# Patient Record
Sex: Male | Born: 1935
Health system: Southern US, Community
[De-identification: ages and names within clinical notes are randomized; demographics above are authoritative.]

## PROBLEM LIST (undated history)

## (undated) DIAGNOSIS — IMO0001 Reserved for inherently not codable concepts without codable children: Secondary | ICD-10-CM

## (undated) DIAGNOSIS — T300 Burn of unspecified body region, unspecified degree: Secondary | ICD-10-CM

## (undated) DIAGNOSIS — I10 Essential (primary) hypertension: Secondary | ICD-10-CM

## (undated) DIAGNOSIS — I2510 Atherosclerotic heart disease of native coronary artery without angina pectoris: Secondary | ICD-10-CM

## (undated) DIAGNOSIS — H409 Unspecified glaucoma: Secondary | ICD-10-CM

## (undated) DIAGNOSIS — F329 Major depressive disorder, single episode, unspecified: Secondary | ICD-10-CM

## (undated) DIAGNOSIS — H547 Unspecified visual loss: Secondary | ICD-10-CM

## (undated) DIAGNOSIS — E785 Hyperlipidemia, unspecified: Secondary | ICD-10-CM

## (undated) DIAGNOSIS — H9190 Unspecified hearing loss, unspecified ear: Secondary | ICD-10-CM

## (undated) DIAGNOSIS — F32A Depression, unspecified: Secondary | ICD-10-CM

## (undated) DIAGNOSIS — Z9289 Personal history of other medical treatment: Secondary | ICD-10-CM

## (undated) DIAGNOSIS — I6350 Cerebral infarction due to unspecified occlusion or stenosis of unspecified cerebral artery: Secondary | ICD-10-CM

## (undated) DIAGNOSIS — I6529 Occlusion and stenosis of unspecified carotid artery: Secondary | ICD-10-CM

## (undated) HISTORY — DX: Atherosclerotic heart disease of native coronary artery without angina pectoris: I25.10

## (undated) HISTORY — DX: Unspecified hearing loss, unspecified ear: H91.90

## (undated) HISTORY — DX: Essential (primary) hypertension: I10

## (undated) HISTORY — DX: Depression, unspecified: F32.A

## (undated) HISTORY — PX: HERNIA REPAIR: SHX51

## (undated) HISTORY — DX: Unspecified visual loss: H54.7

## (undated) HISTORY — DX: Occlusion and stenosis of unspecified carotid artery: I65.29

## (undated) HISTORY — DX: Burn of unspecified body region, unspecified degree: T30.0

## (undated) HISTORY — DX: Personal history of other medical treatment: Z92.89

## (undated) HISTORY — DX: Cerebral infarction due to unspecified occlusion or stenosis of unspecified cerebral artery: I63.50

## (undated) HISTORY — DX: Reserved for inherently not codable concepts without codable children: IMO0001

## (undated) HISTORY — DX: Unspecified glaucoma: H40.9

## (undated) HISTORY — DX: Hyperlipidemia, unspecified: E78.5

## (undated) HISTORY — DX: Major depressive disorder, single episode, unspecified: F32.9

---

## 2007-05-17 ENCOUNTER — Ambulatory Visit: Payer: Self-pay | Admitting: Gastroenterology

## 2010-08-15 ENCOUNTER — Ambulatory Visit: Payer: Self-pay | Admitting: Ophthalmology

## 2010-08-25 ENCOUNTER — Ambulatory Visit: Payer: Self-pay | Admitting: Ophthalmology

## 2011-01-28 ENCOUNTER — Ambulatory Visit: Payer: Self-pay | Admitting: Ophthalmology

## 2011-08-21 ENCOUNTER — Inpatient Hospital Stay: Payer: Self-pay | Admitting: Internal Medicine

## 2011-08-21 ENCOUNTER — Ambulatory Visit: Payer: Self-pay | Admitting: Oncology

## 2011-08-21 LAB — URINALYSIS, COMPLETE
Bacteria: NONE SEEN
Glucose,UR: NEGATIVE mg/dL (ref 0–75)
Nitrite: NEGATIVE
RBC,UR: 4 /HPF (ref 0–5)
Specific Gravity: 1.017 (ref 1.003–1.030)
WBC UR: <1 /HPF (ref 0–5)

## 2011-08-21 LAB — COMPREHENSIVE METABOLIC PANEL
Alkaline Phosphatase: 80 U/L (ref 50–136)
BUN: 23 mg/dL — ABNORMAL HIGH (ref 7–18)
Bilirubin,Total: 0.4 mg/dL (ref 0.2–1.0)
Chloride: 102 mmol/L (ref 98–107)
Co2: 27 mmol/L (ref 21–32)
Creatinine: 1.31 mg/dL — ABNORMAL HIGH (ref 0.60–1.30)
EGFR (African American): >60
EGFR (Non-African Amer.): 56 — ABNORMAL LOW
Osmolality: 282 (ref 275–301)
Potassium: 3.9 mmol/L (ref 3.5–5.1)
SGOT(AST): 33 U/L (ref 15–37)
Sodium: 139 mmol/L (ref 136–145)
Total Protein: 7.9 g/dL (ref 6.4–8.2)

## 2011-08-21 LAB — CBC
HGB: 14.3 g/dL (ref 13.0–18.0)
RBC: 4.47 10*6/uL (ref 4.40–5.90)
WBC: 8.8 10*3/uL (ref 3.8–10.6)

## 2011-08-21 LAB — SEDIMENTATION RATE: Erythrocyte Sed Rate: 12 mm/hr (ref 0–20)

## 2011-08-21 LAB — TROPONIN I: Troponin-I: <0.02 ng/mL

## 2011-08-22 LAB — CBC WITH DIFFERENTIAL/PLATELET
Basophil #: 0 10*3/uL (ref 0.0–0.1)
HCT: 38.3 % — ABNORMAL LOW (ref 40.0–52.0)
HGB: 12.8 g/dL — ABNORMAL LOW (ref 13.0–18.0)
Lymphocyte #: 2.1 10*3/uL (ref 1.0–3.6)
MCH: 32.4 pg (ref 26.0–34.0)
MCHC: 33.5 g/dL (ref 32.0–36.0)
Monocyte #: 1 10*3/uL — ABNORMAL HIGH (ref 0.0–0.7)
Monocyte %: 15.7 %
Neutrophil %: 51.4 %
RDW: 15.1 % — ABNORMAL HIGH (ref 11.5–14.5)
WBC: 6.6 10*3/uL (ref 3.8–10.6)

## 2011-08-22 LAB — COMPREHENSIVE METABOLIC PANEL
Albumin: 3 g/dL — ABNORMAL LOW (ref 3.4–5.0)
Alkaline Phosphatase: 71 U/L (ref 50–136)
Anion Gap: 9 (ref 7–16)
Bilirubin,Total: 0.4 mg/dL (ref 0.2–1.0)
Calcium, Total: 8.4 mg/dL — ABNORMAL LOW (ref 8.5–10.1)
Glucose: 81 mg/dL (ref 65–99)
Osmolality: 276 (ref 275–301)
Sodium: 138 mmol/L (ref 136–145)
Total Protein: 6.4 g/dL (ref 6.4–8.2)

## 2011-08-23 LAB — CBC WITH DIFFERENTIAL/PLATELET
Basophil %: 0.4 %
Eosinophil %: 0.4 %
HCT: 39.4 % — ABNORMAL LOW (ref 40.0–52.0)
HGB: 13 g/dL (ref 13.0–18.0)
MCH: 32 pg (ref 26.0–34.0)
MCHC: 33.1 g/dL (ref 32.0–36.0)
Monocyte #: 1.1 10*3/uL — ABNORMAL HIGH (ref 0.0–0.7)
Neutrophil #: 3.2 10*3/uL (ref 1.4–6.5)
Neutrophil %: 52.9 %
RBC: 4.07 10*6/uL — ABNORMAL LOW (ref 4.40–5.90)
WBC: 6.1 10*3/uL (ref 3.8–10.6)

## 2011-08-23 LAB — BASIC METABOLIC PANEL
Anion Gap: 13 (ref 7–16)
BUN: 12 mg/dL (ref 7–18)
Calcium, Total: 8.6 mg/dL (ref 8.5–10.1)
EGFR (African American): >60
EGFR (Non-African Amer.): >60
Glucose: 81 mg/dL (ref 65–99)
Sodium: 142 mmol/L (ref 136–145)

## 2011-08-23 LAB — LIPID PANEL
Cholesterol: 109 mg/dL (ref 0–200)
Ldl Cholesterol, Calc: 62 mg/dL (ref 0–100)
Triglycerides: 84 mg/dL (ref 0–200)
VLDL Cholesterol, Calc: 17 mg/dL (ref 5–40)

## 2011-08-24 LAB — BASIC METABOLIC PANEL
Anion Gap: 13 (ref 7–16)
BUN: 17 mg/dL (ref 7–18)
Chloride: 108 mmol/L — ABNORMAL HIGH (ref 98–107)
Co2: 23 mmol/L (ref 21–32)
Creatinine: 1.07 mg/dL (ref 0.60–1.30)
EGFR (African American): >60
EGFR (Non-African Amer.): >60
Osmolality: 288 (ref 275–301)
Potassium: 3.8 mmol/L (ref 3.5–5.1)
Sodium: 144 mmol/L (ref 136–145)

## 2011-08-24 LAB — PROTEIN ELECTROPHORESIS(ARMC)

## 2011-08-25 LAB — UR PROT ELECTROPHORESIS, URINE RANDOM

## 2011-08-31 ENCOUNTER — Ambulatory Visit: Payer: Self-pay | Admitting: Oncology

## 2011-09-11 ENCOUNTER — Ambulatory Visit: Payer: Self-pay | Admitting: Vascular Surgery

## 2011-09-21 ENCOUNTER — Ambulatory Visit: Payer: Self-pay | Admitting: Oncology

## 2011-09-23 ENCOUNTER — Encounter: Payer: Self-pay | Admitting: Cardiovascular Disease

## 2011-09-23 ENCOUNTER — Ambulatory Visit (INDEPENDENT_AMBULATORY_CARE_PROVIDER_SITE_OTHER): Payer: Medicare Other | Admitting: Cardiovascular Disease

## 2011-09-23 VITALS — BP 110/62 | HR 61 | Ht 68.0 in | Wt 154.0 lb

## 2011-09-23 DIAGNOSIS — E785 Hyperlipidemia, unspecified: Secondary | ICD-10-CM | POA: Insufficient documentation

## 2011-09-23 DIAGNOSIS — I10 Essential (primary) hypertension: Secondary | ICD-10-CM | POA: Insufficient documentation

## 2011-09-23 DIAGNOSIS — I6529 Occlusion and stenosis of unspecified carotid artery: Secondary | ICD-10-CM

## 2011-09-23 DIAGNOSIS — Z01818 Encounter for other preprocedural examination: Secondary | ICD-10-CM

## 2011-09-23 MED ORDER — LISINOPRIL-HYDROCHLOROTHIAZIDE 10-12.5 MG PO TABS: 1.0000 | ORAL_TABLET | Freq: Every day | ORAL | Status: DC

## 2011-09-23 MED ORDER — LOVASTATIN ER 40 MG PO TB24: 40.0000 mg | ORAL_TABLET | Freq: Every day | ORAL | Status: DC

## 2011-09-23 MED ORDER — CLOPIDOGREL BISULFATE 75 MG PO TABS: 75.0000 mg | ORAL_TABLET | Freq: Every day | ORAL | Status: DC

## 2011-09-23 MED ORDER — METOPROLOL TARTRATE 25 MG PO TABS: 25.0000 mg | ORAL_TABLET | Freq: Two times a day (BID) | ORAL | Status: DC

## 2011-09-23 NOTE — Assessment & Plan Note (Signed)
Blood pressure is well controlled on today's visit. No changes made to the medications. 

## 2011-09-23 NOTE — Progress Notes (Signed)
Patient ID: [name], male    DOB: 1930-02-23, 78 y.o.   MRN: [id number]  HPI Comments: [name] is a pleasant 78 year old gentleman with history of recent CVA 08/28/2011 with residual peripheral vision loss on the right, found to have severe left internal carotid artery stenosis confirmed on CT scan of the neck, also with  history of smoking who stopped in his 30s (15 year smoking history), who presents for preoperative evaluation prior to carotid surgery. Notes also indicate history of hyperlipidemia and hypertension.  He reports that prior to his stroke, he was very active with no complaints. He denies any history of chest pain, shortness of breath. No known coronary artery disease. He manages callus on a farm, does mowing, fixes fences. At baseline is very active. At the beginning of March, his activity level changed after his stroke. He still denies any chest pain or shortness of breath and is active, limited only by his vision change.  He was evaluated in the hospital. Echocardiogram was done that was essentially normal apart from mild LVH. His normal LV function estimated ejection fraction greater than 55%. No significant valvular disease  He did have a PET scan for a nodule in his lung CT scan of the chest was evaluated. There was minimal aortic atherosclerosis. He does have at least mild coronary artery disease though image quality is poor secondary to cardiac motion.  EKG shows normal sinus rhythm with rate 61 beats per minute with no significant ST or T wave changes   Outpatient Encounter Prescriptions as of 09/23/2011  Medication Sig Dispense Refill  . aspirin 81 MG tablet Take 81 mg by mouth daily.      . clopidogrel (PLAVIX) 75 MG tablet Take 1 tablet (75 mg total) by mouth daily.  30 tablet  11  . lisinopril-hydrochlorothiazide (PRINZIDE,ZESTORETIC) 10-12.5 MG per tablet Take 1 tablet by mouth daily.  30 tablet  11  . lovastatin (ALTOPREV) 40 MG 24 hr tablet Take 1 tablet  (40 mg total) by mouth at bedtime.  30 tablet  11  . metoprolol tartrate (LOPRESSOR) 25 MG tablet Take 1 tablet (25 mg total) by mouth 2 (two) times daily.  60 tablet  11  . pravastatin (PRAVACHOL) 40 MG tablet Take 40 mg by mouth daily.        Review of Systems  Constitutional: Negative.   HENT: Negative.   Eyes: Positive for visual disturbance.  Respiratory: Negative.   Cardiovascular: Negative.   Gastrointestinal: Negative.   Musculoskeletal: Negative.   Skin: Negative.   Neurological: Negative.   Hematological: Negative.   Psychiatric/Behavioral: Negative.   All other systems reviewed and are negative.    BP 110/62  Pulse 61  Ht 5\' 8"  (1.727 m)  Wt 154 lb (69.854 kg)  BMI 23.42 kg/m2  Physical Exam  Nursing note and vitals reviewed. Constitutional: He is oriented to person, place, and time. He appears well-developed and well-nourished.  HENT:  Head: Normocephalic.  Nose: Nose normal.  Mouth/Throat: Oropharynx is clear and moist.  Eyes: Conjunctivae are normal. Pupils are equal, round, and reactive to light.  Neck: Normal range of motion. Neck supple. No JVD present.  Cardiovascular: Normal rate, regular rhythm, S1 normal, S2 normal, normal heart sounds and intact distal pulses.  Exam reveals no gallop and no friction rub.   No murmur heard. Pulmonary/Chest: Effort normal and breath sounds normal. No respiratory distress. He has no wheezes. He has no rales. He exhibits no tenderness.  Abdominal: Soft. Bowel  sounds are normal. He exhibits no distension. There is no tenderness.  Musculoskeletal: Normal range of motion. He exhibits no edema and no tenderness.  Lymphadenopathy:    He has no cervical adenopathy.  Neurological: He is alert and oriented to person, place, and time. Coordination normal.  Skin: Skin is warm and dry. No rash noted. No erythema.  Psychiatric: He has a normal mood and affect. His behavior is normal. Judgment and thought content normal.            Assessment and Plan

## 2011-09-23 NOTE — Assessment & Plan Note (Signed)
He has lovastatin and pravastatin in his medicine bag. We had suggested he hold pravastatin and continue lovastatin for now

## 2011-09-23 NOTE — Assessment & Plan Note (Signed)
Severe disease noted. He has followup with Dr. Lorretta Harp next week for surgery scheduling. Again he would be acceptable risk with no further testing needed.

## 2011-09-23 NOTE — Assessment & Plan Note (Signed)
He has normal EKG, no symptoms of angina. No prior cardiac history. On careful hydration of his chest CT scan, it does appear that he has mild coronary artery disease in the proximal LAD region. Given his lack of symptoms, normal EKG, high activity level with no symptoms, no further workup is needed at this time and he can proceed with carotid surgery. He would be an acceptable risk. I suggested to him that he needs aggressive cholesterol management, continue aspirin and Plavix after his carotid surgery.

## 2011-09-23 NOTE — Patient Instructions (Addendum)
You are doing well. Continue the lovastatin and hold pravastatin Refill the metoprolol  Please call us if you have new issues that need to be addressed before your next appt.  Your physician wants you to follow-up in: 12 months.  You will receive a reminder letter in the mail two months in advance. If you don't receive a letter, please call our office to schedule the follow-up appointment.  rx's sent in today to [facility] in [place]  Metoprolol Lovastatin plavix Lisinopril-hctz

## 2011-10-14 ENCOUNTER — Inpatient Hospital Stay: Payer: Self-pay | Admitting: Vascular Surgery

## 2011-10-14 LAB — BUN: BUN: 23 mg/dL — ABNORMAL HIGH (ref 7–18)

## 2011-10-14 LAB — CREATININE, SERUM: EGFR (Non-African Amer.): >60

## 2011-10-15 LAB — CBC WITH DIFFERENTIAL/PLATELET
Basophil #: 0 10*3/uL (ref 0.0–0.1)
Basophil %: 0.2 %
Eosinophil #: 0 10*3/uL (ref 0.0–0.7)
Eosinophil %: 0.5 %
HCT: 33.2 % — ABNORMAL LOW (ref 40.0–52.0)
Lymphocyte #: 1.3 10*3/uL (ref 1.0–3.6)
Lymphocyte %: 24.3 %
MCH: 32.3 pg (ref 26.0–34.0)
MCHC: 33.8 g/dL (ref 32.0–36.0)
MCV: 96 fL (ref 80–100)
Monocyte #: 0.9 x10 3/mm (ref 0.2–1.0)
Monocyte %: 17.6 %
RBC: 3.47 10*6/uL — ABNORMAL LOW (ref 4.40–5.90)
RDW: 15.4 % — ABNORMAL HIGH (ref 11.5–14.5)

## 2011-10-15 LAB — BASIC METABOLIC PANEL
BUN: 17 mg/dL (ref 7–18)
Chloride: 107 mmol/L (ref 98–107)
Co2: 25 mmol/L (ref 21–32)
EGFR (African American): >60
EGFR (Non-African Amer.): >60
Osmolality: 280 (ref 275–301)
Potassium: 3.7 mmol/L (ref 3.5–5.1)

## 2011-10-15 LAB — APTT: Activated PTT: 30.6 secs (ref 23.6–35.9)

## 2011-10-15 LAB — PROTIME-INR: Prothrombin Time: 13.2 secs (ref 11.5–14.7)

## 2011-10-20 ENCOUNTER — Encounter: Payer: Self-pay | Admitting: Cardiovascular Disease

## 2012-09-23 ENCOUNTER — Encounter: Payer: Self-pay | Admitting: Cardiovascular Disease

## 2012-09-23 ENCOUNTER — Ambulatory Visit (INDEPENDENT_AMBULATORY_CARE_PROVIDER_SITE_OTHER): Payer: Medicare Other | Admitting: Cardiovascular Disease

## 2012-09-23 VITALS — BP 140/70 | HR 64 | Ht 69.0 in | Wt 161.0 lb

## 2012-09-23 DIAGNOSIS — I10 Essential (primary) hypertension: Secondary | ICD-10-CM

## 2012-09-23 DIAGNOSIS — E785 Hyperlipidemia, unspecified: Secondary | ICD-10-CM

## 2012-09-23 DIAGNOSIS — I6522 Occlusion and stenosis of left carotid artery: Secondary | ICD-10-CM

## 2012-09-23 DIAGNOSIS — I6529 Occlusion and stenosis of unspecified carotid artery: Secondary | ICD-10-CM

## 2012-09-23 NOTE — Assessment & Plan Note (Signed)
Blood pressure is well controlled on today's visit. No changes made to the medications. 

## 2012-09-23 NOTE — Patient Instructions (Addendum)
You are doing well. No medication changes were made.  Please call us if you have new issues that need to be addressed before your next appt.  Your physician wants you to follow-up in: 12 months.  You will receive a reminder letter in the mail two months in advance. If you don't receive a letter, please call our office to schedule the follow-up appointment. 

## 2012-09-23 NOTE — Progress Notes (Signed)
Patient ID: [name], male    DOB: [birth year], 79 y.o.   MRN: [id number]  HPI Comments: [name] is a pleasant 79 year old gentleman with history of recent CVA 08/28/2011 with residual peripheral vision loss on the right, found to have severe left internal carotid artery stenosis confirmed on CT scan of the neck, also with  history of smoking who stopped in his 30s (15 year smoking history), who presents for preoperative evaluation prior to carotid surgery. Notes also indicate history of hyperlipidemia and hypertension.  He continues to live at home and has a caretaker in the daytime. Lives by himself at home at night but calls his caretaker if he needs help. He had a stent to his left carotid. By his report, he had no complications. Currently he feels well, no chest pain or shortness of breath, no edema. No lightheadedness or dizziness.  prior Echocardiogram was done that was essentially normal apart from mild LVH. His normal LV function estimated ejection fraction greater than 55%. No significant valvular disease  He did have a PET scan for a nodule in his lung CT scan of the chest was evaluated. There was minimal aortic atherosclerosis. He does have at least mild coronary artery disease though image quality is poor secondary to cardiac motion.  EKG shows normal sinus rhythm with rate 62 beats per minute with no significant ST or T wave changes   Outpatient Encounter Prescriptions as of 09/23/2012  Medication Sig Dispense Refill  . aspirin 81 MG tablet Take 81 mg by mouth daily.      . clopidogrel (PLAVIX) 75 MG tablet Take 1 tablet (75 mg total) by mouth daily.  30 tablet  11  . lisinopril-hydrochlorothiazide (PRINZIDE,ZESTORETIC) 10-12.5 MG per tablet Take 1 tablet by mouth daily.  30 tablet  11  . lovastatin (ALTOPREV) 40 MG 24 hr tablet Take 1 tablet (40 mg total) by mouth at bedtime.  30 tablet  11  . metoprolol tartrate (LOPRESSOR) 25 MG tablet Take 1 tablet (25 mg total) by  mouth 2 (two) times daily.  60 tablet  11    Review of Systems  Constitutional: Negative.   HENT: Negative.   Eyes: Positive for visual disturbance.  Respiratory: Negative.   Cardiovascular: Negative.   Gastrointestinal: Negative.   Musculoskeletal: Negative.   Skin: Negative.   Neurological: Negative.   Psychiatric/Behavioral: Negative.   All other systems reviewed and are negative.    BP 140/70  Pulse 64  Ht 5\' 9"  (1.753 m)  Wt 161 lb (73.029 kg)  BMI 23.76 kg/m2  Physical Exam  Nursing note and vitals reviewed. Constitutional: He is oriented to person, place, and time. He appears well-developed and well-nourished.  HENT:  Head: Normocephalic.  Nose: Nose normal.  Mouth/Throat: Oropharynx is clear and moist.  Eyes: Conjunctivae are normal. Pupils are equal, round, and reactive to light.  Neck: Normal range of motion. Neck supple. No JVD present.  Cardiovascular: Normal rate, regular rhythm, S1 normal, S2 normal, normal heart sounds and intact distal pulses.  Exam reveals no gallop and no friction rub.   No murmur heard. Pulmonary/Chest: Effort normal and breath sounds normal. No respiratory distress. He has no wheezes. He has no rales. He exhibits no tenderness.  Abdominal: Soft. Bowel sounds are normal. He exhibits no distension. There is no tenderness.  Musculoskeletal: Normal range of motion. He exhibits no edema and no tenderness.  Lymphadenopathy:    He has no cervical adenopathy.  Neurological: He is alert and oriented  to person, place, and time. Coordination normal.  Skin: Skin is warm and dry. No rash noted. No erythema.  Psychiatric: He has a normal mood and affect. His behavior is normal. Judgment and thought content normal.      Assessment and Plan

## 2012-09-23 NOTE — Assessment & Plan Note (Signed)
We have suggested that he stay on his statin

## 2012-09-23 NOTE — Assessment & Plan Note (Signed)
Recent stent placement. Doing well following his procedure.

## 2012-09-26 ENCOUNTER — Other Ambulatory Visit: Payer: Self-pay | Admitting: Cardiovascular Disease

## 2012-11-23 ENCOUNTER — Other Ambulatory Visit: Payer: Self-pay | Admitting: Cardiovascular Disease

## 2012-11-23 NOTE — Telephone Encounter (Signed)
Refill sent to [facility]. Lovastatin #30 R#3

## 2013-09-25 ENCOUNTER — Ambulatory Visit (INDEPENDENT_AMBULATORY_CARE_PROVIDER_SITE_OTHER): Payer: Commercial Managed Care - HMO | Admitting: Cardiovascular Disease

## 2013-09-25 ENCOUNTER — Encounter: Payer: Self-pay | Admitting: Cardiovascular Disease

## 2013-09-25 VITALS — BP 140/80 | HR 65 | Ht 67.5 in | Wt 166.8 lb

## 2013-09-25 DIAGNOSIS — I25118 Atherosclerotic heart disease of native coronary artery with other forms of angina pectoris: Secondary | ICD-10-CM | POA: Insufficient documentation

## 2013-09-25 DIAGNOSIS — I10 Essential (primary) hypertension: Secondary | ICD-10-CM

## 2013-09-25 DIAGNOSIS — I6529 Occlusion and stenosis of unspecified carotid artery: Secondary | ICD-10-CM

## 2013-09-25 DIAGNOSIS — I2510 Atherosclerotic heart disease of native coronary artery without angina pectoris: Secondary | ICD-10-CM

## 2013-09-25 DIAGNOSIS — E785 Hyperlipidemia, unspecified: Secondary | ICD-10-CM

## 2013-09-25 NOTE — Assessment & Plan Note (Signed)
Blood pressure is well controlled on today's visit. No changes made to the medications. 

## 2013-09-25 NOTE — Patient Instructions (Signed)
You are doing well. No medication changes were made.  Please call us if you have new issues that need to be addressed before your next appt.  Your physician wants you to follow-up in: 12 months.  You will receive a reminder letter in the mail two months in advance. If you don't receive a letter, please call our office to schedule the follow-up appointment. 

## 2013-09-25 NOTE — Assessment & Plan Note (Signed)
Recent carotid stent performed by Dr. Ronalee Belts.

## 2013-09-25 NOTE — Progress Notes (Signed)
Patient ID: [name], male    DOB: 05-21-1930, 80 y.o.   MRN: [id number]  HPI Comments: Mr. Wilhoite is a pleasant 80 year old gentleman with history of recent CVA 08/28/2011 with residual peripheral vision loss on the right, found to have severe left internal carotid artery stenosis confirmed on CT scan of the neck, recent stent placed by Dr. Ronalee Belts,   history of smoking who stopped in his 24s (15 year smoking history), hyperlipidemia and hypertension. He presents for routine followup  He continues to live at home and has a caretaker in the daytime. Lives by himself at home at night but calls his caretaker if he needs help.  He had a stent to his left carotid.  no complications.  Currently he feels well, no chest pain or shortness of breath, no edema. No lightheadedness or dizziness. He does not do regular exercise program. Legs are getting weaker Notes from primary care indicate a history of depression  Lab work showing total cholesterol 138, LDL 78, HDL 37  prior Echocardiogram was done that was essentially normal apart from mild LVH. His normal LV function estimated ejection fraction greater than 55%. No significant valvular disease  Previous PET scan for a nodule in his lung CT scan of the chest was evaluated. There was minimal aortic atherosclerosis. He does have at least mild coronary artery disease though image quality is poor secondary to cardiac motion.  EKG shows normal sinus rhythm with rate 65 beats per minute with old inferior MI   Outpatient Encounter Prescriptions as of 09/25/2013  Medication Sig  . aspirin 81 MG tablet Take 81 mg by mouth daily.  . clopidogrel (PLAVIX) 75 MG tablet Take 1 tablet (75 mg total) by mouth daily.  . finasteride (PROSCAR) 5 MG tablet Take 5 mg by mouth daily.  Marland Kitchen lisinopril-hydrochlorothiazide (PRINZIDE,ZESTORETIC) 10-12.5 MG per tablet TAKE ONE TABLET BY MOUTH EVERY DAY  . lovastatin (MEVACOR) 40 MG tablet TAKE ONE TABLET BY MOUTH AT  BEDTIME  . metoprolol tartrate (LOPRESSOR) 25 MG tablet Take 1 tablet (25 mg total) by mouth 2 (two) times daily.  . sertraline (ZOLOFT) 25 MG tablet Take 25 mg by mouth daily.    Review of Systems  Constitutional: Negative.   HENT: Negative.   Eyes: Positive for visual disturbance.  Respiratory: Negative.   Cardiovascular: Negative.   Gastrointestinal: Negative.   Endocrine: Negative.   Musculoskeletal: Negative.   Skin: Negative.   Allergic/Immunologic: Negative.   Neurological: Negative.   Hematological: Negative.   Psychiatric/Behavioral: Negative.   All other systems reviewed and are negative.    BP 140/80  Pulse 65  Ht 5' 7.5" (1.715 m)  Wt 166 lb 12 oz (75.637 kg)  BMI 25.72 kg/m2  Physical Exam  Nursing note and vitals reviewed. Constitutional: He is oriented to person, place, and time. He appears well-developed and well-nourished.  HENT:  Head: Normocephalic.  Nose: Nose normal.  Mouth/Throat: Oropharynx is clear and moist.  Eyes: Conjunctivae are normal. Pupils are equal, round, and reactive to light.  Neck: Normal range of motion. Neck supple. No JVD present.  Cardiovascular: Normal rate, regular rhythm, S1 normal, S2 normal, normal heart sounds and intact distal pulses.  Exam reveals no gallop and no friction rub.   No murmur heard. Pulmonary/Chest: Effort normal and breath sounds normal. No respiratory distress. He has no wheezes. He has no rales. He exhibits no tenderness.  Abdominal: Soft. Bowel sounds are normal. He exhibits no distension. There is no tenderness.  Musculoskeletal:  Normal range of motion. He exhibits no edema and no tenderness.  Lymphadenopathy:    He has no cervical adenopathy.  Neurological: He is alert and oriented to person, place, and time. Coordination normal.  Skin: Skin is warm and dry. No rash noted. No erythema.  Psychiatric: He has a normal mood and affect. His behavior is normal. Judgment and thought content normal.       Assessment and Plan

## 2013-09-25 NOTE — Assessment & Plan Note (Signed)
Encouraged him to stay on his lovastatin. Goal LDL less than 70

## 2013-09-25 NOTE — Assessment & Plan Note (Signed)
EKG suggesting old inferior MI. He reports he is asymptomatic and does not want further workup at this time. He is relatively inactive

## 2013-11-22 ENCOUNTER — Emergency Department: Payer: Self-pay | Admitting: Emergency Medicine

## 2013-11-22 LAB — URINALYSIS, COMPLETE
BILIRUBIN, UR: NEGATIVE
BLOOD: NEGATIVE
Bacteria: NONE SEEN
Glucose,UR: NEGATIVE mg/dL (ref 0–75)
Hyaline Cast: 2
Ketone: NEGATIVE
Nitrite: NEGATIVE
Ph: 6 (ref 4.5–8.0)
RBC,UR: 23 /HPF (ref 0–5)
Specific Gravity: 1.018 (ref 1.003–1.030)
Squamous Epithelial: 1
WBC UR: 18 /HPF (ref 0–5)

## 2013-11-22 LAB — CBC WITH DIFFERENTIAL/PLATELET
BASOS PCT: 0.7 %
Basophil #: 0 10*3/uL (ref 0.0–0.1)
EOS ABS: 0.1 10*3/uL (ref 0.0–0.7)
EOS PCT: 0.8 %
HCT: 39.7 % — AB (ref 40.0–52.0)
HGB: 13.2 g/dL (ref 13.0–18.0)
LYMPHS ABS: 1.9 10*3/uL (ref 1.0–3.6)
Lymphocyte %: 29.9 %
MCH: 30.3 pg (ref 26.0–34.0)
MCHC: 33.3 g/dL (ref 32.0–36.0)
MCV: 91 fL (ref 80–100)
MONOS PCT: 18.2 %
Monocyte #: 1.1 x10 3/mm — ABNORMAL HIGH (ref 0.2–1.0)
NEUTROS ABS: 3.1 10*3/uL (ref 1.4–6.5)
Neutrophil %: 50.4 %
Platelet: 231 10*3/uL (ref 150–440)
RBC: 4.36 10*6/uL — ABNORMAL LOW (ref 4.40–5.90)
RDW: 17.8 % — AB (ref 11.5–14.5)
WBC: 6.2 10*3/uL (ref 3.8–10.6)

## 2013-11-22 LAB — BASIC METABOLIC PANEL
Anion Gap: 6 — ABNORMAL LOW (ref 7–16)
BUN: 18 mg/dL (ref 7–18)
CALCIUM: 9 mg/dL (ref 8.5–10.1)
CHLORIDE: 106 mmol/L (ref 98–107)
CO2: 26 mmol/L (ref 21–32)
Creatinine: 1.31 mg/dL — ABNORMAL HIGH (ref 0.60–1.30)
EGFR (African American): 59 — ABNORMAL LOW
EGFR (Non-African Amer.): 51 — ABNORMAL LOW
GLUCOSE: 90 mg/dL (ref 65–99)
Osmolality: 277 (ref 275–301)
Potassium: 4.2 mmol/L (ref 3.5–5.1)
SODIUM: 138 mmol/L (ref 136–145)

## 2013-11-22 LAB — TROPONIN I: Troponin-I: <0.02 ng/mL

## 2013-11-23 ENCOUNTER — Ambulatory Visit: Payer: Self-pay | Admitting: Neurology

## 2013-11-23 ENCOUNTER — Inpatient Hospital Stay: Payer: Self-pay | Admitting: Specialist

## 2013-11-24 LAB — CBC WITH DIFFERENTIAL/PLATELET
BASOS ABS: 0.1 10*3/uL (ref 0.0–0.1)
Basophil %: 0.9 %
EOS ABS: 0.1 10*3/uL (ref 0.0–0.7)
EOS PCT: 0.9 %
HCT: 39 % — ABNORMAL LOW (ref 40.0–52.0)
HGB: 12.7 g/dL — AB (ref 13.0–18.0)
LYMPHS PCT: 25.6 %
Lymphocyte #: 1.8 10*3/uL (ref 1.0–3.6)
MCH: 29.2 pg (ref 26.0–34.0)
MCHC: 32.5 g/dL (ref 32.0–36.0)
MCV: 90 fL (ref 80–100)
Monocyte #: 1.5 x10 3/mm — ABNORMAL HIGH (ref 0.2–1.0)
Monocyte %: 21.3 %
NEUTROS PCT: 51.3 %
Neutrophil #: 3.5 10*3/uL (ref 1.4–6.5)
PLATELETS: 242 10*3/uL (ref 150–440)
RBC: 4.34 10*6/uL — AB (ref 4.40–5.90)
RDW: 17.7 % — ABNORMAL HIGH (ref 11.5–14.5)
WBC: 6.9 10*3/uL (ref 3.8–10.6)

## 2013-11-24 LAB — COMPREHENSIVE METABOLIC PANEL
ALBUMIN: 2.9 g/dL — AB (ref 3.4–5.0)
AST: 22 U/L (ref 15–37)
Alkaline Phosphatase: 111 U/L
Anion Gap: 7 (ref 7–16)
BUN: 16 mg/dL (ref 7–18)
Bilirubin,Total: 0.3 mg/dL (ref 0.2–1.0)
CALCIUM: 8.9 mg/dL (ref 8.5–10.1)
Chloride: 108 mmol/L — ABNORMAL HIGH (ref 98–107)
Co2: 24 mmol/L (ref 21–32)
Creatinine: 1.04 mg/dL (ref 0.60–1.30)
EGFR (African American): >60
EGFR (Non-African Amer.): >60
GLUCOSE: 98 mg/dL (ref 65–99)
OSMOLALITY: 279 (ref 275–301)
Potassium: 3.9 mmol/L (ref 3.5–5.1)
SGPT (ALT): 18 U/L (ref 12–78)
Sodium: 139 mmol/L (ref 136–145)
Total Protein: 6.9 g/dL (ref 6.4–8.2)

## 2013-11-24 LAB — LIPID PANEL
Cholesterol: 119 mg/dL (ref 0–200)
HDL: 28 mg/dL — AB (ref 40–60)
LDL CHOLESTEROL, CALC: 71 mg/dL (ref 0–100)
TRIGLYCERIDES: 98 mg/dL (ref 0–200)
VLDL CHOLESTEROL, CALC: 20 mg/dL (ref 5–40)

## 2013-11-24 LAB — MAGNESIUM: Magnesium: 1.8 mg/dL

## 2013-11-24 LAB — URINE CULTURE

## 2013-11-24 LAB — TSH: THYROID STIMULATING HORM: 4.65 u[IU]/mL — AB

## 2013-11-25 ENCOUNTER — Encounter: Payer: Self-pay | Admitting: Internal Medicine

## 2013-12-20 ENCOUNTER — Encounter: Payer: Self-pay | Admitting: Internal Medicine

## 2014-09-25 ENCOUNTER — Ambulatory Visit (INDEPENDENT_AMBULATORY_CARE_PROVIDER_SITE_OTHER): Payer: Medicare PPO | Admitting: Cardiovascular Disease

## 2014-09-25 ENCOUNTER — Encounter: Payer: Self-pay | Admitting: Cardiovascular Disease

## 2014-09-25 VITALS — BP 126/58 | HR 68 | Ht 69.0 in | Wt 162.0 lb

## 2014-09-25 DIAGNOSIS — E785 Hyperlipidemia, unspecified: Secondary | ICD-10-CM | POA: Diagnosis not present

## 2014-09-25 DIAGNOSIS — I10 Essential (primary) hypertension: Secondary | ICD-10-CM | POA: Diagnosis not present

## 2014-09-25 DIAGNOSIS — H547 Unspecified visual loss: Secondary | ICD-10-CM

## 2014-09-25 DIAGNOSIS — I25119 Atherosclerotic heart disease of native coronary artery with unspecified angina pectoris: Secondary | ICD-10-CM | POA: Diagnosis not present

## 2014-09-25 DIAGNOSIS — I6522 Occlusion and stenosis of left carotid artery: Secondary | ICD-10-CM | POA: Diagnosis not present

## 2014-09-25 DIAGNOSIS — F329 Major depressive disorder, single episode, unspecified: Secondary | ICD-10-CM

## 2014-09-25 DIAGNOSIS — F32A Depression, unspecified: Secondary | ICD-10-CM | POA: Insufficient documentation

## 2014-09-25 NOTE — Assessment & Plan Note (Signed)
Cholesterol is at goal on the current lipid regimen. No changes to the medications were made. Goal total cholesterol less than 150

## 2014-09-25 NOTE — Assessment & Plan Note (Signed)
Currently with no symptoms of angina. No further workup at this time. Continue current medication regimen. 

## 2014-09-25 NOTE — Assessment & Plan Note (Signed)
Blood pressure is well controlled on today's visit. No changes made to the medications. 

## 2014-09-25 NOTE — Assessment & Plan Note (Signed)
Vision deficit following stroke. No further TIA or stroke type symptoms

## 2014-09-25 NOTE — Assessment & Plan Note (Signed)
We have called Dr. Alver Fisher office to arrange follow-up ultrasound.
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
## 2014-09-25 NOTE — Patient Instructions (Signed)
You are doing well. No medication changes were made.  Please call us if you have new issues that need to be addressed before your next appt.  Your physician wants you to follow-up in: 12 months.  You will receive a reminder letter in the mail two months in advance. If you don't receive a letter, please call our office to schedule the follow-up appointment. 

## 2014-09-25 NOTE — Assessment & Plan Note (Signed)
Managed by primary care, on sertraline

## 2014-10-13 NOTE — Discharge Summary (Signed)
PATIENT NAME:  [name], [name] MR#:  [id number] DATE OF BIRTH:  1929-12-03  DATE OF ADMISSION:  11/23/2013 DATE OF DISCHARGE:  11/24/2013  For a detailed note, please take a look at the history and physical done on admission by Dr. Margaretmary Eddy.   DISCHARGE DIAGNOSES:  1.  Acute cerebrovascular accident.  2.  History of previous cerebrovascular accident.  3.  Hypertension.  4.  Benign prostatic hypertrophy.  5.  Suspected urinary tract infection.  DISCHARGE DIET: The patient is being discharged on a low-sodium, low-fat diet.   DISCHARGE ACTIVITY: As tolerated.   DISCHARGE FOLLOWUP: With Dr. Otilio Miu in the next 1 to 2 weeks.  DISCHARGE MEDICATIONS: Lovastatin 40 mg daily, aspirin 81 mg daily, Plavix 75 mg daily, metoprolol tartrate 25 mg b.i.d., lisinopril 10 mg daily, vitamin D3 daily, Ocuvite eye vitamins daily, fish oil daily, Zoloft 25 mg daily, finasteride 5 mg daily, multivitamin daily, ciprofloxacin 250 mg b.i.d. x3 days.   CONSULTANTS DURING THE HOSPITAL COURSE: Dr. Irish Elders from neurology.  PERTINENT STUDIES DONE DURING THE HOSPITAL COURSE: CT scan of the head done without contrast on admission showed no acute intracranial findings.   Ultrasound of the carotids showed no hemodynamically significant carotid artery stenosis.   MRI of the brain done without contrast showed acute infarction likely involving the left posterior limb of the internal capsule. Remote left PCA territory infarct.   A 2-dimensional echocardiogram showed ejection fraction of 50% to 55%. Normal global LV systolic function. No thrombus. Mild mitral valve regurgitation.   BRIEF HOSPITAL COURSE: This is an 81 year old male with medical problems as mentioned above who presented to the hospital with right lower extremity weakness and difficulty ambulating.  1.  Acute CVA. This is likely the cause of the patient's right lower extremity weakness and difficulty ambulating. The patient had an extensive work-up  including a CT scan and MRI of the brain which did show a stroke. The patient was on aspirin and Plavix and he continued on that. A neurology consult was obtained. The patient was seen by Dr. Irish Elders from neurology who recommended continuing the dual antiplatelet agents for now. The patient is also on a statin. We will continue that. The patient did have 2 recent strokes in 2 different territories and therefore neurology wanted to rule out a cardiac source. Therefore, we will arrange an outpatient 30 day event/loop monitor. The patient was also seen by physical therapy and they recommended for short-term rehab, which is where he presently is being discharged. Clinically, the patient has significantly improved since admission and his weakness on his right side has improved.  2.  Hypertension. The patient remained hemodynamically stable, had some labile blood pressures, but some element of hypertension was tolerated given the fact that he had an acute stroke. For now he will continue his metoprolol and lisinopril.  3.  Hyperlipidemia. The patient was maintained on his lovastatin. He will resume that.  4.  Depression. The patient was maintained on his Zoloft. He will also resume that. 5.  Suspected urinary tract infection. This was based on a urinalysis on admission. He was treated with IV ceftriaxone in the hospital. He is being discharged on oral Cipro for the next 3 days.  6.  BPH. The patient was maintained on his finasteride and he will also resume that upon discharge.   CODE STATUS: The patient is a DNI/ DNR.   TIME SPENT ON DISCHARGE: 40 minutes.   ____________________________ Belia Heman. Verdell Carmine, MD vjs:sb D: 11/24/2013 12:03:58  ET T: 11/24/2013 12:23:14 ET JOB#: [id number]  cc: Belia Heman. Verdell Carmine, MD, <Dictator> Juline Patch, MD Henreitta Leber MD ELECTRONICALLY SIGNED 11/25/2013 21:56

## 2014-10-13 NOTE — H&P (Signed)
PATIENT NAME:  [name], [name] MR#:  [id number] DATE OF BIRTH:  Jul 17, 1929  DATE OF ADMISSION:  11/23/2013  PRIMARY CARE PHYSICIAN:  Dr. Otilio Miu.  REFERRING ER PHYSICIAN:  Dr. Joni Fears.   CHIEF COMPLAINT: Right-sided weakness.   HISTORY OF PRESENT ILLNESS: The patient is an 81 year old Caucasian male with a past medical history of old stroke, right lower extremity residual weakness, hypertension, hyperlipidemia, glaucoma and bilateral hearing problems. He was brought into the ER for frequent falls. Since yesterday morning, the patient has been unsteady and feeling weak on his right-hand side. Though patient has chronic right lower extremity residual weakness from previous stroke, since yesterday morning, he has been feeling weak on the right-hand side and gait was wobbly and falling to his right side whenever he walks. Denies any headache. Denies any blurry vision or double vision. Denies any slurry speech or problems with swallowing. As his right-sided weakness and falling often, the patient is brought into the ER. The patient's balance was also off.  In the ER, the patient's pronator drift is weak on the right side. CAT scan of the head did not reveal any acute changes. The patient was given aspirin and hospitalist team is called to admit the patient as his symptoms are persistent for more than 18 hours. During my examination, the patient denies any headache or blurry vision. He has passed bedside swallow evaluation. Denies any slurry speech. Denies any chest pain or shortness of breath. No other complaints. The patient's blood pressure was elevated when he came in, and he has received labetalol 10 mg IV and, subsequently, blood pressure went down to 155. The patient's two sisters and son are at bedside.    PAST MEDICAL HISTORY:  Hypertension, hyperlipidemia, old history of stroke, glaucoma, hearing defects.    PAST SURGICAL HISTORY: Hernia repair,  right carotid endarterectomy, cataract  surgery, glaucoma surgery.    ALLERGIES: No known drug allergies.   PSYCHOSOCIAL HISTORY: Lives at home, lives alone. Denies any history of smoking, alcohol or illicit drug usage.   FAMILY HISTORY: Hypertension runs in his family.   HOME MEDICATIONS:  Sertraline 25 mg p.o. once daily, multivitamin p.o. once daily, metoprolol 25 mg p.o. b.i.d., lovastatin 40 mg p.o. once daily, lisinopril 10 mg once daily, fish oil once daily dose unknown, finasteride 5 mg p.o. once daily, aspirin 81 mg p.o. once daily.   REVIEW OF SYSTEMS:   CONSTITUTIONAL: Denies any fever, fatigue. No weight loss or weight gain.  EYES: Denies blurry vision, double vision. Has chronic history of glaucoma and legally blind.  ENT: Denies epistaxis, discharge, tinnitus.  RESPIRATION: Denies cough, COPD, denies any shortness of breath.  CARDIOVASCULAR: No chest pain, palpitations, dizziness or syncope.  GASTROINTESTINAL: Denies nausea, vomiting, diarrhea, abdominal pain.  HEMATOLOGIC AND LYMPHATIC: No history of cancers. No anemia, easy bruising or bleeding.  INTEGUMENTARY: Denies any acne, rash, lesions.  MUSCULOSKELETAL: No joint effusion. Denies any arthritis. No swelling, denies any gout.  NEUROLOGIC: Has old history of stroke and right lower extremity weakness. Weakness of the right hand started getting worse today, started since yesterday morning. The patient has ataxia. Denies any blurry vision or speech defects. PSYCHIATRIC: Denies any insomnia, ADD or OCD. It looks like the patient has anxiety versus depression. He takes Zoloft at home.   PHYSICAL EXAMINATION: VITAL SIGNS: Temperature 98.3, pulse 92, respirations 18, blood pressure 157/76, pulse oximetry is 96%.  GENERAL APPEARANCE: Not in any acute distress. Moderately built and nourished.  HEENT: Normocephalic, atraumatic. Pupils  are equally reacting to light and accommodation. No scleral icterus. No conjunctival injection. No sinus tenderness. No postnasal drip.  Moist mucous membranes. No deviation of the angle of the mouth.  NECK: Is supple, no JVD. Left carotid endarterectomy, scar is healing well. Range of motion is intact.  LUNGS: Clear to auscultation bilaterally. No accessory muscle use and no anterior chest wall tenderness on palpation.   CARDIAC: S1, S2 normal. Regular rate and rhythm. No murmurs.  GASTROINTESTINAL: Soft. Bowel sounds are positive in all four quadrants. Nontender, nondistended. No hepatosplenomegaly. No masses.  NEUROLOGIC: Awake, alert, oriented x 3. Head is normocephalic, atraumatic. Pupils are equal, reacting to light and accommodation. Motor: right upper extremity and lower extremity is 5/5. Sensory is intact. Motor is intact. Pronator drift is abnormal with is abnormal with right-sided weakness. Positive cerebellar signs. He is past-pointing.  Gait is abnormal, leaning towards right-hand side.  Reflexes are 2+.  EXTREMITIES: No edema. No cyanosis. No clubbing.  PSYCHIATRIC: Normal mood and affect.   LABORATORIES AND IMAGING STUDIES: Troponin less than 0.02. CBC is normal.  Urinalysis: Yellow in color, hazy in appearance, ketones negative. Specific gravity 1.018, glucose trace, nitrites negative. WBCs present, hyaline casts are present. Glucose 90, BUN 18, creatinine 1.31, sodium 130, potassium 4.2, chloride and CO2 are normal. Anion gap is 6. GFR 51. Serum osmolality and calcium are normal.   CAT scan of the head without contrast has revealed no acute intracranial findings, stable atrophy, extensive chronic small vessel disease and old left occipital lobe infarct.   ASSESSMENT AND PLAN: An 81 year old Caucasian male brought into the ER for worsening of right-sided weakness since yesterday morning, associated with frequent falls and wobbly gait and deviating towards the right-hand side. He will be admitted with the following assessment and plan:  1. Acute clinical stroke. We will admit him to telemetry. CT head is negative. Will  get complete stroke work-up with MRA of the brain, carotid Doppler's and a 2-D echocardiogram. We will provide him aspirin 325 mg and Plavix 75 mg. Check fasting lipid panel and resume his home medication statin. PT consult is placed regarding worsening of right-sided weakness and neurology consult is placed. 2. Hypertension. Blood pressure is elevated. After labetalol is given, the systolic blood pressure is around 155. We will allow permissive hypertension in view of clinical stroke-like symptoms. 3. Hyperlipidemia. Check fasting lipid panel and will continue statin.  4. History of old stroke with residual right lower extremity weakness. Physical therapy consult is placed.  5. Acute kidney injury, probably prerenal. We will provide him intravenous fluids and monitor renal function closely. Avoid nephrotoxins.  6. Possible urinary tract infection. Will get urine culture and sensitivity and put him on IV Rocephin empirically. If the cultures are negative, we will discontinue antibiotics.   7.  History of glaucoma and chronic hearing problems.  8.  We will provide gastrointestinal and deep vein thrombosis prophylaxis.  9.  CODE STATUS: Is DO NOT RESUSCITATE. Daughter is the medical power of attorney.   Diagnosis and plan of care was discussed in detail with the patient. He is aware of the plan.  Total time spent on admission: 50 minutes.    ____________________________ Nicholes Mango, MD ag:aj D: 11/23/2013 03:43:56 ET T: 11/23/2013 05:55:18 ET JOB#: [id number]  cc: Nicholes Mango, MD, <Dictator> Juline Patch, MD  Nicholes Mango MD ELECTRONICALLY SIGNED 11/27/2013 0:57

## 2014-10-13 NOTE — Consult Note (Signed)
PATIENT NAME:  [name], ALA CAPRI MR#:  [id number] DATE OF BIRTH:  1930/04/23  DATE OF CONSULTATION:  11/23/2013  CONSULTING PHYSICIAN:  Leotis Pain, MD  REASON FOR CONSULTATION:  Stroke.  This is an 81 year old male with past medical history of old stroke a few months back with residual right upper and right lower extremity weakness, hypertension, hyperlipidemia and glaucoma, bilateral hearing problems, history of right-sided carotid stenting, presenting with dizziness, unsteady gait and multiple falls to the right side. On presentation, the patient also noted that his right side is weaker than it has been from the previous stroke. Throughout the hospital stay, the patient's strength has improved. Status post CAT scan of the head, no acute intracranial abnormalities. Status post MRI of the brain, the patient was found to left posterior limb internal capsule remote stroke, as well as remote left PCA stroke.   PAST MEDICAL HISTORY: Hypertension, hyperlipidemia, left PCA stroke, glaucoma and hard of hearing.   PAST SURGICAL HISTORY: History of hernia repair, right carotid endarterectomy, cataract surgery, glaucoma surgery.   ALLERGIES: No known drug allergies.   PSYCHOSOCIAL HISTORY: Lives at home, lives alone. Denies a history of smoking, alcohol or illicit drug use.   FAMILY HISTORY: Hypertension.   HOME MEDICATIONS: Include sertraline, multivitamin, metoprolol, lovastatin, lisinopril, fish oil, finasteride, aspirin and Plavix daily.   LABORATORY WORKUP: Reviewed.   IMAGING: As described above with MRI showing left posterior limb internal capsule stroke that is acute in nature.  ULTRASOUND OF THE CAROTIDS: Less than 50% stenosis bilaterally.   REVIEW OF SYSTEMS:  Denies any fever. Denies any fatigue. No weight loss. No weight gain.  EYES: :Right field cut, which is chronic from old stroke. Has history of glaucoma and legally blind. Denies any cough, shortness of breath. Denies any  chest pain. Denies any abdominal pain. Denies any diarrhea or constipation. Denies any history of insomnia. Positive history for depression, which he takes Zoloft for.   PHYSICAL EXAMINATION: VITAL SIGNS: Include a temperature of 97.6, pulse 76, respirations 16, blood pressure 195/78, pulse ox 96% on room air.   NEUROLOGIC EVALUATION: The patient was able to tell me his name, was able to tell me he was in [place]. Did not know the name of the hospital. Could not tell me the date and time. On cranial nerve examination, extraocular movements intact. Visual fields: Right homonymous hemianopsia is present, suspected from an old left PCA infarct, possibly mild right facial droop. Tongue is midline. Uvula elevates symmetrically. Shoulder shrug is intact. Motor strength: Drift of the upper extremity, which is 4/5, right lower extremity 4+/5. The rest is 5/5. Reflexes are diminished. Coordination intact. Sensation diminished on the right side. The patient has sensory neglect on the right side as well. Gait not assessed.   IMPRESSION: An 81 year old gentleman with past medical history of hypertension, hyperlipidemia, old stroke a few months back with residual right upper and right lower extremity weakness, on aspirin and Plavix, presents with new acute ischemia left posterior limb internal capsule. Posterior limb of internal capsule supplied by anterior circulation, which was an MCA anterior and anterior choroidal artery. The patient had an old stroke in the posterior cerebral artery distribution, which is likely coming from posterior circulation unless he has a fetal posterior cerebral artery. Ultrasound of carotids were reviewed and no significant hemodynamic stenosis.   PLAN: We will continue dual antiplatelet therapy. Would like the patient to be referred for Holter monitor, as the patient has 2  recent strokes in 2 different  vascular territories. I want to make sure this is a noncardiac source. I want to  make sure there is no cardiac arrhythmia. Physical therapy, occupational therapy, blood pressure control, 2-D echo, follow up with neurology as an outpatient.   Thank you, it was a pleasure seeing this patient. This case was discussed with primary team.   ____________________________ Leotis Pain, MD yz:dmm D: 11/23/2013 17:25:06 ET T: 11/23/2013 17:53:51 ET JOB#: [id number]  cc: Leotis Pain, MD, <Dictator> Leotis Pain MD ELECTRONICALLY SIGNED 12/07/2013 17:20

## 2014-10-14 NOTE — Discharge Summary (Signed)
PATIENT NAME:  [name] MR#:  [id number] DATE OF BIRTH:  07-06-1929  DATE OF ADMISSION:  08/21/2011 DATE OF DISCHARGE:  08/24/2011  PRIMARY CARE PHYSICIAN:  Dr. Otilio Miu  FINAL DIAGNOSES:  1. Acute cerebrovascular accident. Decreased vision in looking towards the right and right shoulder weakness.  2. Carotid stenosis on the left.  3. Hypertension.  4. Hyperlipidemia.  5. Lucencies on the skull.  6. Lung nodule.   MEDICATIONS ON DISCHARGE:  1. Aspirin 81 mg p.o. daily.  2. Plavix 75 mg p.o. nightly.  3. Metoprolol 25 mg p.o. twice a day.  4. Lisinopril/HCT 10/12.5 mg 1 tablet daily.  5. Lovastatin 40 mg p.o. nightly.   DIET: Low sodium diet.   ACTIVITY: Activity as tolerated.   FOLLOWUP:  1. Follow up with Home Health, home PT, home occupational therapy.  2. Follow up with Dr. Grayland Ormond on 08/31/2011 at 1:30 p.m.  3. Follow up with Dr. Delana Meyer in 1 to 2 weeks.  4. Follow up with Dr. Otilio Miu in 2 to 4 weeks.   REASON FOR ADMISSION: The patient was admitted 08/21/2011. He came in with altered mental status.   HISTORY OF PRESENT ILLNESS: This is a 81 year old man with history of glaucoma, hypertension, and hyperlipidemia who presented with altered mental status and weakness. He was outside. He had to lie down next to a fence.  Family brought him to the Emergency Room. He was still confused there.  They did a CT scan of the head which showed multiple lucencies in the skull worrisome for possible multiple myeloma versus metastatic cancer. He was admitted for further evaluation. A serum protein electrophoresis and urine protein electrophoresis was sent off. Sedimentation rate and PSA were ordered. A CT scan of the chest, abdomen, and pelvis was ordered and an MRI of the brain was ordered. The patient was given IV fluid hydration.  LABORATORY AND RADIOLOGICAL DATA DURING THE HOSPITAL COURSE: Sedimentation rate of 12. Troponin negative. Glucose 111, BUN 23, creatinine  1.31, sodium 139, potassium 3.9, chloride 102, CO2 27, calcium 9. Liver function tests normal. White blood cell count 8.8, hemoglobin and hematocrit 14.3 and hematocrit 43.3, platelet count 173. EKG showed a normal sinus rhythm and premature atrial complexes. Chest x-ray showed right upper lobe interstitial prominence and nodularity. CT scan of the head showed lucencies noted throughout the skull; myeloma and/or metastatic disease cannot be excluded. Oncology consultation recommended. Urinalysis showed 1+ blood, trace ketones. CT scan of the chest, abdomen, and pelvis showed a spiculated right upper lobe mass lesion. Scarring or malignancy could present in this fashion. PET CT may prove useful. Innumerable hepatic cysts. Going to the details of the report, the prominent spiculated mass measures 2.5 cm in the right upper lobe. PSA is 1. MRI of the brain with and without contrast showed acute left occipital lobe infarct. Acute infarct also noted in the left thalamus. Ultrasound carotids bilaterally showed a high-grade stenosis in the proximal left internal carotid artery and 50% stenosis of the right internal carotid artery. LDL 62, HDL 30, triglycerides 84. Creatinine upon discharge 1.07. Echocardiogram showed an ejection fraction of greater than 50%, moderate asymmetric left ventricular hypertrophy, mild mitral regurgitation.   CONSULTATIONS: The patient was seen in consultation by Dr. Grayland Ormond of oncology, Dr. Delana Meyer of vascular surgery, physical therapy, and occupational therapy.   HOSPITAL COURSE PER PROBLEM LIST:  1. Acute cerebrovascular accident:  The patient does have decreased vision in both eyes when looking to the right and he  has some slight right shoulder weakness. He was seen in consultation by PT and OT. Initially he was on aspirin and then once the acute cerebrovascular accident was found on MRI, he was switched to Aggrenox.  Then when we found the carotid stenosis he was switched back to  aspirin and Plavix as per vascular surgery. The patient was advised no driving with his vision loss to the right. PT and OT will work with him as outpatient and home R.N. The family is very supportive and will help him out at home. He does have a walking device at home, cane and walker. With his visual issues in looking to the right, walking can be difficult but he did well with physical therapy while here in the hospital.  2. Left carotid stenosis: The patient was seen in consultation by Dr. Delana Meyer who would like medical management for at least 2 to 4 weeks with aspirin and Plavix, then will consider a carotid endarterectomy and/or stenting as an outpatient. The patient will also need a CT angiogram of the carotid arteries prior to this procedure. A follow-up appointment was set up with Dr. Delana Meyer as an outpatient in two weeks.  3. Hypertension: Blood pressure is slightly borderline here. The patient was placed on metoprolol and lisinopril and will be back on his lisinopril/HCT as outpatient. Blood pressure 132/79 and then more recent 165/80.  4. Hyperlipidemia: He will go back on his pravastatin. Cholesterol profile acceptable. LDL 62.  5. Lucencies on the skull: Dr. Grayland Ormond will follow up the patient next Monday on 03/11 at 1:30 p.m. and go over the SPEP and UPEP and figure out whether any further studies will be needed for lucencies on the skull.  6. Lung nodule: I spoke with Dr. Orson Ape the radiologist today.  The PET scan does have mild activity. Unclear whether this is a cancerous process or scarring. He did look at the prior CT scan back in 2002 which showed a right upper lobe nodule at that time, so this could be fibrosis that could also have a mild activity. He is going to review the actual films back in 2002 and come up with a final report in a few days. The patient will follow up with Dr. Grayland Ormond in one week to go over the final report and what to make of this lung nodule; whether this is a  malignancy or just scar tissue is still unclear at this point.   If this does turn out to be a lung cancer,  Dr. Delana Meyer may not proceed with carotid endarterectomy. Further determinations will be done as outpatient through Dr. Gary Fleet office and Dr. Nino Parsley office.  The case was discussed with the patient and family at the time of discharge.   TIME SPENT ON DISCHARGE: 45 minutes.   ____________________________ Tana Conch. Leslye Peer, MD rjw:bjt D: 08/24/2011 16:39:16 ET T: 08/25/2011 14:02:31 ET JOB#: [id number]  cc: Tana Conch. Leslye Peer, MD, <Dictator> Juline Patch, MD Kathlene November. Grayland Ormond, MD Katha Cabal, MD Marisue Brooklyn MD ELECTRONICALLY SIGNED 08/26/2011 13:14
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
## 2014-10-14 NOTE — Op Note (Signed)
PATIENT NAME:  [name], [name] MR#:  [id number] DATE OF BIRTH:  Sep 27, 1929  DATE OF PROCEDURE:  10/14/2011  PREOPERATIVE DIAGNOSIS: Symptomatic critical stenosis of the left internal carotid artery.   POSTOPERATIVE DIAGNOSIS: Symptomatic critical stenosis of the left internal carotid artery.   PROCEDURE PERFORMED: Left carotid stent placement.   SURGEON: Katha Cabal, MD  ASSISTANT: Dr. Leotis Pain   ANESTHESIA: Conscious sedation, Versed 1 mg plus fentanyl 50 mcg administered IV. Continuous ECG, pulse oximetry and cardiopulmonary monitoring is performed throughout the entire procedure by the interventional radiology nurse. Total sedation time was one hour.   ACCESS: Right groin 6 French sheath.   CONTRAST USED: Isovue 60 mL.   FLUOROSCOPY TIME: Approximately seven minutes.   INDICATIONS: [name] is a 81 year old gentleman who demonstrated transient ischemic attack symptoms on the right side of his body. Work-up demonstrated critical stenosis of the left internal carotid artery. Further analysis including CT angiography demonstrated a very high bifurcation which would make surgical repair significantly more complicated and risky and therefore carotid stenting is being employed. The risks and benefits were reviewed. All questions are answered. Patient has agreed to proceed.   DESCRIPTION OF PROCEDURE: Patient is taken to the special procedure suite, placed in the supine position. After adequate sedation is achieved, both groins are prepped and draped in a sterile fashion. 1% lidocaine is infiltrated in the soft tissues overlying the right femoral impulse and access to the right common femoral artery is obtained with a micropuncture needle, microwire followed by micro sheath is inserted. J-wire followed by 5 French sheath and 5 French pigtail catheter is then advanced up and into the descending aorta. LAO projection of the aortic arch is obtained. Review of the images demonstrates  bovine anatomy, type I to II arch is noted. Critical stenosis of the left internal carotid artery is also identified and confirmed. 7000 units of heparin is given. ACT done approximately eight minutes later demonstrates an ACT of 330.   The pigtail catheter is then exchanged over a stiff angled Glidewire for a Simmons 1 and the origin of the common carotid artery is engaged. Wire and catheter are negotiated into the external carotid artery and subsequently the Amplatz superstiff wire is advanced and seated within the external carotid artery. Simmons catheter and 5 [place] sheath are removed and a 6 [place] 90 cm shuttle sheath is then advanced and positioned just below the carotid bifurcation. Dilator and wire are removed. Images of the carotid are then obtained in multiple planes. The small Nav-6 Emboshield device is then used to cross the lesion and opened without difficulty. A 9 x 7 tapered Exact stent is then deployed across the lesion and postdilated with a 4.5 x 2 balloon. Follow-up angiography demonstrates that there is still a lip in the proximal edge still within the internal carotid artery and a second 9 x 20 straight Exact stent is deployed across this area and postdilated to 5. No neurological changes were noted during any of the stent deployments. The patient did experience some bradycardia prior to deploying the stent balloon angioplasty and was therefore given with 1 amp of atropine in preparation for balloon dilatation. He did not change his heart rate after or during the actual stent deployment and balloon manipulations.   The Emboshield was then retrieved without difficulty. Follow-up angiography demonstrated filling of the middle and anterior cerebral arteries which was significantly improved over the preop, wide patency of the stent with good apposition of the wall in  multiple views. The sheath was then pulled into the right external and the StarClose device deployed without difficulty. There  were no immediate complications. The patient was then taken to the recovery area in stable condition.   INTERPRETATION: Initial views demonstrate a type I to type II arch, bovine anatomy, ostia of the great vessels are all widely patent. There is a string sign greater than 97% stenosis of the left internal carotid artery. The middle cerebral and anterior cerebral fill very poorly initially. Following angioplasty and stent placement as described above, there is wide patency of the internal carotid artery with now significantly improved filling of the cerebral vessels.   SUMMARY: Successful carotid stenting of the left internal carotid artery as described above. ____________________________ Katha Cabal, MD ggs:cms D: 10/16/2011 10:27:45 ET T: 10/16/2011 12:00:45 ET  JOB#: [id number] cc: Juline Patch, MD Katha Cabal MD ELECTRONICALLY SIGNED 10/30/2011 7:51
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
## 2014-10-14 NOTE — Consult Note (Signed)
Brief Consult Note: Diagnosis: Acute left hemispheric stenosis; Critical stensosis of the left ICA; Chest mass; Lytic lesion of the skull.   Patient was seen by consultant.   Recommend further assessment or treatment.   Discussed with Attending MD.   Comments: The patient will need a CT angio, he has residual weakness of the right arm, and there are two acute strokes.  Therefore, any surgery or intervention will require 2-4 weeks of rest before treatment.  Furthermore, the nature of the patient masses/malignancies needs to be evaluated as prognosis has a huge impact on whether to proceed with treatment of the carotid (if the prognosis is very poor, < 1 year survival, then medical therapy of the carotid would be better than surgery).  Electronic Signatures: Hortencia Pilar (MD)  (Signed 03-Mar-13 14:47)  Authored: Brief Consult Note   Last Updated: 03-Mar-13 14:47 by Hortencia Pilar (MD)

## 2014-10-16 ENCOUNTER — Ambulatory Visit
Admit: 2014-10-16 | Disposition: A | Discharge status: Home / Self Care | Payer: Self-pay | Attending: Family Medicine | Admitting: Family Medicine

## 2014-12-18 ENCOUNTER — Telehealth: Payer: Self-pay

## 2014-12-18 NOTE — Telephone Encounter (Signed)
-----   Message from Noralyn Pick sent at 12/18/2014 11:35 AM EDT ----- Regarding: Patient Referral Contact: [phone] Dr Erlene Quan saw this patient and recommended a referral to GI for rectal nodule.  I will fax over the notes from his last visit (11/16/14).  The patient has [employer] so I cannot make the referral for him.    Thanks!

## 2015-01-22 ENCOUNTER — Other Ambulatory Visit: Payer: Self-pay

## 2015-01-23 ENCOUNTER — Other Ambulatory Visit: Payer: Self-pay | Admitting: Nurse Practitioner

## 2015-01-23 DIAGNOSIS — K6289 Other specified diseases of anus and rectum: Secondary | ICD-10-CM

## 2015-01-28 ENCOUNTER — Ambulatory Visit
Admission: RE | Admit: 2015-01-28 | Discharge: 2015-01-28 | Disposition: A | Discharge status: Home / Self Care | Payer: Medicare PPO | Source: Ambulatory Visit | Attending: Nurse Practitioner | Admitting: Nurse Practitioner

## 2015-01-28 DIAGNOSIS — K629 Disease of anus and rectum, unspecified: Secondary | ICD-10-CM | POA: Insufficient documentation

## 2015-01-28 DIAGNOSIS — N400 Enlarged prostate without lower urinary tract symptoms: Secondary | ICD-10-CM | POA: Diagnosis not present

## 2015-01-28 DIAGNOSIS — K6289 Other specified diseases of anus and rectum: Secondary | ICD-10-CM

## 2015-01-28 MED ORDER — IOHEXOL 300 MG/ML  SOLN
100.0000 mL | Freq: Once | INTRAMUSCULAR | Status: AC | PRN
  Administered 2015-01-28: 100 mL via INTRAVENOUS

## 2015-02-01 ENCOUNTER — Other Ambulatory Visit: Payer: Self-pay | Admitting: Nurse Practitioner

## 2015-02-01 DIAGNOSIS — R911 Solitary pulmonary nodule: Secondary | ICD-10-CM

## 2015-02-07 ENCOUNTER — Ambulatory Visit
Admission: RE | Admit: 2015-02-07 | Discharge: 2015-02-07 | Disposition: A | Discharge status: Home / Self Care | Payer: Medicare PPO | Source: Ambulatory Visit | Attending: Nurse Practitioner | Admitting: Nurse Practitioner

## 2015-02-07 DIAGNOSIS — K7689 Other specified diseases of liver: Secondary | ICD-10-CM | POA: Diagnosis not present

## 2015-02-07 DIAGNOSIS — J432 Centrilobular emphysema: Secondary | ICD-10-CM | POA: Insufficient documentation

## 2015-02-07 DIAGNOSIS — R911 Solitary pulmonary nodule: Secondary | ICD-10-CM | POA: Insufficient documentation

## 2015-02-07 MED ORDER — IOHEXOL 300 MG/ML  SOLN
75.0000 mL | Freq: Once | INTRAMUSCULAR | Status: AC | PRN
  Administered 2015-02-07: 75 mL via INTRAVENOUS

## 2015-03-27 ENCOUNTER — Other Ambulatory Visit: Payer: Self-pay | Admitting: Family Medicine

## 2015-03-27 DIAGNOSIS — E785 Hyperlipidemia, unspecified: Secondary | ICD-10-CM

## 2015-03-27 DIAGNOSIS — F329 Major depressive disorder, single episode, unspecified: Secondary | ICD-10-CM

## 2015-03-27 DIAGNOSIS — N400 Enlarged prostate without lower urinary tract symptoms: Secondary | ICD-10-CM

## 2015-03-27 DIAGNOSIS — F32A Depression, unspecified: Secondary | ICD-10-CM

## 2015-03-27 DIAGNOSIS — I10 Essential (primary) hypertension: Secondary | ICD-10-CM

## 2015-03-27 DIAGNOSIS — I639 Cerebral infarction, unspecified: Secondary | ICD-10-CM

## 2015-05-24 ENCOUNTER — Other Ambulatory Visit: Payer: Self-pay | Admitting: Family Medicine

## 2015-05-28 ENCOUNTER — Other Ambulatory Visit: Payer: Self-pay

## 2015-07-10 ENCOUNTER — Ambulatory Visit (INDEPENDENT_AMBULATORY_CARE_PROVIDER_SITE_OTHER): Payer: Medicare PPO | Admitting: Family Medicine

## 2015-07-10 ENCOUNTER — Encounter: Payer: Self-pay | Admitting: Family Medicine

## 2015-07-10 VITALS — BP 130/80 | HR 72 | Ht 69.0 in | Wt 160.0 lb

## 2015-07-10 DIAGNOSIS — I10 Essential (primary) hypertension: Secondary | ICD-10-CM | POA: Diagnosis not present

## 2015-07-10 DIAGNOSIS — N400 Enlarged prostate without lower urinary tract symptoms: Secondary | ICD-10-CM | POA: Diagnosis not present

## 2015-07-10 DIAGNOSIS — I679 Cerebrovascular disease, unspecified: Secondary | ICD-10-CM | POA: Diagnosis not present

## 2015-07-10 DIAGNOSIS — F32A Depression, unspecified: Secondary | ICD-10-CM

## 2015-07-10 DIAGNOSIS — F329 Major depressive disorder, single episode, unspecified: Secondary | ICD-10-CM | POA: Diagnosis not present

## 2015-07-10 DIAGNOSIS — E785 Hyperlipidemia, unspecified: Secondary | ICD-10-CM | POA: Diagnosis not present

## 2015-07-10 MED ORDER — METOPROLOL TARTRATE 25 MG PO TABS: 25.0000 mg | ORAL_TABLET | Freq: Two times a day (BID) | ORAL | Status: DC

## 2015-07-10 MED ORDER — CLOPIDOGREL BISULFATE 75 MG PO TABS: 75.0000 mg | ORAL_TABLET | Freq: Every day | ORAL | Status: DC

## 2015-07-10 MED ORDER — SERTRALINE HCL 25 MG PO TABS: 25.0000 mg | ORAL_TABLET | Freq: Every day | ORAL | Status: DC

## 2015-07-10 MED ORDER — LOVASTATIN 40 MG PO TABS: 40.0000 mg | ORAL_TABLET | Freq: Every day | ORAL | Status: DC

## 2015-07-10 MED ORDER — ASPIRIN 81 MG PO TABS: 81.0000 mg | ORAL_TABLET | Freq: Every day | ORAL | Status: DC

## 2015-07-10 MED ORDER — HYDROCHLOROTHIAZIDE 12.5 MG PO CAPS: 12.5000 mg | ORAL_CAPSULE | Freq: Every day | ORAL | Status: DC

## 2015-07-10 MED ORDER — FINASTERIDE 5 MG PO TABS: 5.0000 mg | ORAL_TABLET | Freq: Every day | ORAL | Status: DC
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
## 2015-07-11 LAB — RENAL FUNCTION PANEL
Albumin: 3.7 g/dL (ref 3.5–4.7)
BUN / CREAT RATIO: 13 (ref 10–22)
BUN: 14 mg/dL (ref 8–27)
CO2: 25 mmol/L (ref 18–29)
CREATININE: 1.04 mg/dL (ref 0.76–1.27)
Calcium: 9.1 mg/dL (ref 8.6–10.2)
Chloride: 101 mmol/L (ref 96–106)
GFR, EST AFRICAN AMERICAN: 76 mL/min/{1.73_m2} (ref >59)
GFR, EST NON AFRICAN AMERICAN: 66 mL/min/{1.73_m2} (ref >59)
Glucose: 92 mg/dL (ref 65–99)
Phosphorus: 3.1 mg/dL (ref 2.5–4.5)
Potassium: 4.2 mmol/L (ref 3.5–5.2)
SODIUM: 141 mmol/L (ref 134–144)

## 2015-07-11 LAB — LIPID PANEL
CHOL/HDL RATIO: 4.1 ratio (ref 0.0–5.0)
Cholesterol, Total: 140 mg/dL (ref 100–199)
HDL: 34 mg/dL — ABNORMAL LOW (ref >39)
LDL Calculated: 87 mg/dL (ref 0–99)
Triglycerides: 97 mg/dL (ref 0–149)
VLDL CHOLESTEROL CAL: 19 mg/dL (ref 5–40)

## 2015-07-11 LAB — PSA: Prostate Specific Ag, Serum: 0.6 ng/mL (ref 0.0–4.0)

## 2015-09-25 ENCOUNTER — Encounter: Payer: Self-pay | Admitting: Cardiovascular Disease

## 2015-09-25 ENCOUNTER — Ambulatory Visit (INDEPENDENT_AMBULATORY_CARE_PROVIDER_SITE_OTHER): Payer: Medicare PPO | Admitting: Cardiovascular Disease

## 2015-09-25 VITALS — BP 120/64 | HR 74 | Ht 69.0 in | Wt 163.0 lb

## 2015-09-25 DIAGNOSIS — I25119 Atherosclerotic heart disease of native coronary artery with unspecified angina pectoris: Secondary | ICD-10-CM | POA: Diagnosis not present

## 2015-09-25 DIAGNOSIS — I6522 Occlusion and stenosis of left carotid artery: Secondary | ICD-10-CM | POA: Diagnosis not present

## 2015-09-25 DIAGNOSIS — E785 Hyperlipidemia, unspecified: Secondary | ICD-10-CM | POA: Diagnosis not present

## 2015-09-25 DIAGNOSIS — I10 Essential (primary) hypertension: Secondary | ICD-10-CM
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
## 2015-09-25 NOTE — Patient Instructions (Signed)
You are doing well. No medication changes were made.  Please call us if you have new issues that need to be addressed before your next appt.  Your physician wants you to follow-up in: 6 months.  You will receive a reminder letter in the mail two months in advance. If you don't receive a letter, please call our office to schedule the follow-up appointment.   

## 2015-09-25 NOTE — Assessment & Plan Note (Signed)
Blood pressure is well controlled on today's visit. No changes made to the medications. 

## 2015-09-25 NOTE — Assessment & Plan Note (Signed)
We will arrange for ultrasound next year if not performed by vascular surgery Continue aggressive cholesterol management

## 2015-09-25 NOTE — Assessment & Plan Note (Signed)
Cholesterol is close to goal on the current lipid regimen. No changes to the medications were made.

## 2015-12-30 ENCOUNTER — Other Ambulatory Visit: Payer: Self-pay | Admitting: Family Medicine

## 2015-12-31 ENCOUNTER — Other Ambulatory Visit: Payer: Self-pay | Admitting: Family Medicine

## 2016-01-06 ENCOUNTER — Other Ambulatory Visit: Payer: Self-pay | Admitting: Family Medicine

## 2016-01-29 ENCOUNTER — Other Ambulatory Visit: Payer: Self-pay | Admitting: Family Medicine

## 2016-02-03 ENCOUNTER — Ambulatory Visit (INDEPENDENT_AMBULATORY_CARE_PROVIDER_SITE_OTHER): Payer: Medicare PPO | Admitting: Family Medicine

## 2016-02-03 ENCOUNTER — Encounter: Payer: Self-pay | Admitting: Family Medicine

## 2016-02-03 VITALS — BP 120/82 | HR 72 | Ht 69.0 in | Wt 164.0 lb

## 2016-02-03 DIAGNOSIS — F32A Depression, unspecified: Secondary | ICD-10-CM

## 2016-02-03 DIAGNOSIS — I25119 Atherosclerotic heart disease of native coronary artery with unspecified angina pectoris: Secondary | ICD-10-CM

## 2016-02-03 DIAGNOSIS — N400 Enlarged prostate without lower urinary tract symptoms: Secondary | ICD-10-CM | POA: Diagnosis not present

## 2016-02-03 DIAGNOSIS — I679 Cerebrovascular disease, unspecified: Secondary | ICD-10-CM

## 2016-02-03 DIAGNOSIS — F329 Major depressive disorder, single episode, unspecified: Secondary | ICD-10-CM

## 2016-02-03 DIAGNOSIS — I10 Essential (primary) hypertension: Secondary | ICD-10-CM

## 2016-02-03 DIAGNOSIS — E785 Hyperlipidemia, unspecified: Secondary | ICD-10-CM

## 2016-02-03 MED ORDER — METOPROLOL TARTRATE 25 MG PO TABS: 25.0000 mg | ORAL_TABLET | Freq: Two times a day (BID) | ORAL | 1 refills | Status: DC

## 2016-02-03 MED ORDER — HYDROCHLOROTHIAZIDE 12.5 MG PO CAPS: 12.5000 mg | ORAL_CAPSULE | Freq: Every day | ORAL | 1 refills | Status: DC

## 2016-02-03 MED ORDER — LISINOPRIL 10 MG PO TABS: 10.0000 mg | ORAL_TABLET | Freq: Every day | ORAL | 1 refills | Status: DC

## 2016-02-03 MED ORDER — ASPIRIN 81 MG PO TABS: 81.0000 mg | ORAL_TABLET | Freq: Every day | ORAL | 11 refills | Status: DC

## 2016-02-03 MED ORDER — CLOPIDOGREL BISULFATE 75 MG PO TABS: 75.0000 mg | ORAL_TABLET | Freq: Every day | ORAL | 1 refills | Status: DC

## 2016-02-03 MED ORDER — LOVASTATIN 40 MG PO TABS: 40.0000 mg | ORAL_TABLET | Freq: Every day | ORAL | 1 refills | Status: DC

## 2016-02-03 MED ORDER — FINASTERIDE 5 MG PO TABS: 5.0000 mg | ORAL_TABLET | Freq: Every day | ORAL | 1 refills | Status: DC

## 2016-02-03 MED ORDER — SERTRALINE HCL 25 MG PO TABS: 25.0000 mg | ORAL_TABLET | Freq: Every day | ORAL | 1 refills | Status: DC
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
## 2016-02-04 LAB — RENAL FUNCTION PANEL
Albumin: 3.5 g/dL (ref 3.5–4.7)
BUN/Creatinine Ratio: 15 (ref 10–24)
BUN: 15 mg/dL (ref 8–27)
CO2: 24 mmol/L (ref 18–29)
Calcium: 9 mg/dL (ref 8.6–10.2)
Chloride: 100 mmol/L (ref 96–106)
Creatinine, Ser: 0.99 mg/dL (ref 0.76–1.27)
GFR calc Af Amer: 81 mL/min/{1.73_m2} (ref >59)
GFR calc non Af Amer: 70 mL/min/{1.73_m2} (ref >59)
Glucose: 86 mg/dL (ref 65–99)
Phosphorus: 3.2 mg/dL (ref 2.5–4.5)
Potassium: 4 mmol/L (ref 3.5–5.2)
Sodium: 139 mmol/L (ref 134–144)

## 2016-02-04 LAB — LIPID PANEL
Chol/HDL Ratio: 3.9 ratio units (ref 0.0–5.0)
Cholesterol, Total: 140 mg/dL (ref 100–199)
HDL: 36 mg/dL — ABNORMAL LOW (ref >39)
LDL Calculated: 81 mg/dL (ref 0–99)
Triglycerides: 114 mg/dL (ref 0–149)
VLDL Cholesterol Cal: 23 mg/dL (ref 5–40)

## 2016-02-04 LAB — PSA: Prostate Specific Ag, Serum: 0.6 ng/mL (ref 0.0–4.0)

## 2016-02-13 ENCOUNTER — Other Ambulatory Visit: Payer: Self-pay

## 2016-04-06 ENCOUNTER — Ambulatory Visit (INDEPENDENT_AMBULATORY_CARE_PROVIDER_SITE_OTHER): Payer: Medicare PPO | Admitting: Cardiovascular Disease

## 2016-04-06 ENCOUNTER — Encounter: Payer: Self-pay | Admitting: Cardiovascular Disease

## 2016-04-06 VITALS — BP 135/78 | HR 66 | Ht 69.0 in | Wt 166.2 lb

## 2016-04-06 DIAGNOSIS — E7800 Pure hypercholesterolemia, unspecified: Secondary | ICD-10-CM

## 2016-04-06 DIAGNOSIS — H547 Unspecified visual loss: Secondary | ICD-10-CM

## 2016-04-06 DIAGNOSIS — I679 Cerebrovascular disease, unspecified: Secondary | ICD-10-CM

## 2016-04-06 DIAGNOSIS — I6522 Occlusion and stenosis of left carotid artery: Secondary | ICD-10-CM | POA: Diagnosis not present

## 2016-04-06 DIAGNOSIS — I25119 Atherosclerotic heart disease of native coronary artery with unspecified angina pectoris: Secondary | ICD-10-CM | POA: Diagnosis not present

## 2016-04-06 DIAGNOSIS — R2681 Unsteadiness on feet: Secondary | ICD-10-CM
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
## 2016-04-06 NOTE — Patient Instructions (Addendum)
Medication Instructions:   No medication changes made  Labwork:  No new labs needed  Testing/Procedures:  We will order a carotid ultrasound for carotid stenosis, hx of stent   Follow-Up: It was a pleasure seeing you in the office today. Please call us if you have new issues that need to be addressed before your next appt.  [phone]  Your physician wants you to follow-up in: 12 months.  You will receive a reminder letter in the mail two months in advance. If you don't receive a letter, please call our office to schedule the follow-up appointment.  If you need a refill on your cardiac medications before your next appointment, please call your pharmacy.

## 2016-04-28 ENCOUNTER — Ambulatory Visit: Payer: Medicare PPO

## 2016-04-28 DIAGNOSIS — E7800 Pure hypercholesterolemia, unspecified: Secondary | ICD-10-CM

## 2016-04-28 DIAGNOSIS — I6523 Occlusion and stenosis of bilateral carotid arteries: Secondary | ICD-10-CM | POA: Diagnosis not present

## 2016-04-28 DIAGNOSIS — I25119 Atherosclerotic heart disease of native coronary artery with unspecified angina pectoris: Secondary | ICD-10-CM

## 2016-04-28 DIAGNOSIS — I6522 Occlusion and stenosis of left carotid artery: Secondary | ICD-10-CM

## 2016-04-28 DIAGNOSIS — I679 Cerebrovascular disease, unspecified: Secondary | ICD-10-CM

## 2016-04-28 NOTE — Progress Notes (Unsigned)
[name]

## 2016-06-19 ENCOUNTER — Other Ambulatory Visit: Payer: Self-pay | Admitting: Family Medicine

## 2016-06-19 DIAGNOSIS — I10 Essential (primary) hypertension: Secondary | ICD-10-CM

## 2016-06-19 DIAGNOSIS — I679 Cerebrovascular disease, unspecified: Secondary | ICD-10-CM

## 2016-06-19 DIAGNOSIS — N400 Enlarged prostate without lower urinary tract symptoms: Secondary | ICD-10-CM

## 2016-08-06 DIAGNOSIS — H401113 Primary open-angle glaucoma, right eye, severe stage: Secondary | ICD-10-CM | POA: Diagnosis not present

## 2016-11-17 ENCOUNTER — Other Ambulatory Visit: Payer: Self-pay

## 2016-11-17 DIAGNOSIS — I10 Essential (primary) hypertension: Secondary | ICD-10-CM

## 2016-11-17 MED ORDER — LISINOPRIL 10 MG PO TABS: 10.0000 mg | ORAL_TABLET | Freq: Every day | ORAL | 0 refills | Status: DC

## 2016-11-26 ENCOUNTER — Ambulatory Visit (INDEPENDENT_AMBULATORY_CARE_PROVIDER_SITE_OTHER): Payer: Medicare HMO | Admitting: Family Medicine

## 2016-11-26 ENCOUNTER — Encounter: Payer: Self-pay | Admitting: Family Medicine

## 2016-11-26 VITALS — BP 110/60 | HR 84 | Ht 69.0 in | Wt 167.0 lb

## 2016-11-26 DIAGNOSIS — E782 Mixed hyperlipidemia: Secondary | ICD-10-CM | POA: Diagnosis not present

## 2016-11-26 DIAGNOSIS — I679 Cerebrovascular disease, unspecified: Secondary | ICD-10-CM

## 2016-11-26 DIAGNOSIS — R69 Illness, unspecified: Secondary | ICD-10-CM | POA: Diagnosis not present

## 2016-11-26 DIAGNOSIS — F3341 Major depressive disorder, recurrent, in partial remission: Secondary | ICD-10-CM

## 2016-11-26 DIAGNOSIS — I10 Essential (primary) hypertension: Secondary | ICD-10-CM

## 2016-11-26 MED ORDER — SERTRALINE HCL 25 MG PO TABS: 25.0000 mg | ORAL_TABLET | Freq: Every day | ORAL | 1 refills | Status: DC

## 2016-11-26 MED ORDER — LOVASTATIN 40 MG PO TABS: 40.0000 mg | ORAL_TABLET | Freq: Every day | ORAL | 1 refills | Status: DC

## 2016-11-26 MED ORDER — CLOPIDOGREL BISULFATE 75 MG PO TABS: 75.0000 mg | ORAL_TABLET | Freq: Every day | ORAL | 1 refills | Status: DC

## 2016-11-26 MED ORDER — LISINOPRIL 5 MG PO TABS: 5.0000 mg | ORAL_TABLET | Freq: Every day | ORAL | 1 refills | Status: DC

## 2016-11-26 MED ORDER — METOPROLOL TARTRATE 25 MG PO TABS: 25.0000 mg | ORAL_TABLET | Freq: Two times a day (BID) | ORAL | 1 refills | Status: DC
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
## 2016-11-27 LAB — RENAL FUNCTION PANEL
Albumin: 3.9 g/dL (ref 3.5–4.7)
BUN / CREAT RATIO: 13 (ref 10–24)
BUN: 16 mg/dL (ref 8–27)
CALCIUM: 9.3 mg/dL (ref 8.6–10.2)
CHLORIDE: 103 mmol/L (ref 96–106)
CO2: 24 mmol/L (ref 18–29)
Creatinine, Ser: 1.28 mg/dL — ABNORMAL HIGH (ref 0.76–1.27)
GFR calc Af Amer: 59 mL/min/{1.73_m2} — ABNORMAL LOW (ref >59)
GFR calc non Af Amer: 51 mL/min/{1.73_m2} — ABNORMAL LOW (ref >59)
Glucose: 88 mg/dL (ref 65–99)
Phosphorus: 2.9 mg/dL (ref 2.5–4.5)
Potassium: 4.5 mmol/L (ref 3.5–5.2)
SODIUM: 140 mmol/L (ref 134–144)

## 2016-11-27 LAB — LIPID PANEL
CHOLESTEROL TOTAL: 136 mg/dL (ref 100–199)
Chol/HDL Ratio: 4.7 ratio (ref 0.0–5.0)
HDL: 29 mg/dL — ABNORMAL LOW (ref >39)
LDL Calculated: 60 mg/dL (ref 0–99)
Triglycerides: 237 mg/dL — ABNORMAL HIGH (ref 0–149)
VLDL Cholesterol Cal: 47 mg/dL — ABNORMAL HIGH (ref 5–40)

## 2016-12-09 DIAGNOSIS — H401113 Primary open-angle glaucoma, right eye, severe stage: Secondary | ICD-10-CM | POA: Diagnosis not present

## 2017-01-29 ENCOUNTER — Other Ambulatory Visit: Payer: Medicare HMO

## 2017-01-29 DIAGNOSIS — R7989 Other specified abnormal findings of blood chemistry: Secondary | ICD-10-CM | POA: Diagnosis not present

## 2017-01-30 LAB — RENAL FUNCTION PANEL
ALBUMIN: 3.9 g/dL (ref 3.5–4.7)
BUN/Creatinine Ratio: 11 (ref 10–24)
BUN: 12 mg/dL (ref 8–27)
CALCIUM: 9.1 mg/dL (ref 8.6–10.2)
CHLORIDE: 105 mmol/L (ref 96–106)
CO2: 19 mmol/L — AB (ref 20–29)
Creatinine, Ser: 1.13 mg/dL (ref 0.76–1.27)
GFR calc non Af Amer: 59 mL/min/{1.73_m2} — ABNORMAL LOW (ref >59)
GFR, EST AFRICAN AMERICAN: 69 mL/min/{1.73_m2} (ref >59)
GLUCOSE: 79 mg/dL (ref 65–99)
Phosphorus: 2.9 mg/dL (ref 2.5–4.5)
Potassium: 4.3 mmol/L (ref 3.5–5.2)
Sodium: 142 mmol/L (ref 134–144)

## 2017-02-26 ENCOUNTER — Encounter: Payer: Self-pay | Admitting: Family Medicine

## 2017-02-26 ENCOUNTER — Ambulatory Visit (INDEPENDENT_AMBULATORY_CARE_PROVIDER_SITE_OTHER): Payer: Medicare HMO | Admitting: Family Medicine

## 2017-02-26 ENCOUNTER — Ambulatory Visit: Payer: Medicare HMO | Admitting: Family Medicine

## 2017-02-26 VITALS — BP 110/62 | HR 80 | Ht 69.0 in | Wt 166.0 lb

## 2017-02-26 DIAGNOSIS — R69 Illness, unspecified: Secondary | ICD-10-CM | POA: Diagnosis not present

## 2017-02-26 DIAGNOSIS — Z23 Encounter for immunization: Secondary | ICD-10-CM | POA: Diagnosis not present

## 2017-02-26 DIAGNOSIS — F3341 Major depressive disorder, recurrent, in partial remission: Secondary | ICD-10-CM | POA: Diagnosis not present

## 2017-02-26 MED ORDER — SERTRALINE HCL 25 MG PO TABS: 25.0000 mg | ORAL_TABLET | Freq: Every day | ORAL | 1 refills | Status: DC
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
## 2017-04-06 ENCOUNTER — Encounter: Payer: Self-pay | Admitting: Cardiovascular Disease

## 2017-04-06 ENCOUNTER — Ambulatory Visit (INDEPENDENT_AMBULATORY_CARE_PROVIDER_SITE_OTHER): Payer: Medicare HMO | Admitting: Cardiovascular Disease

## 2017-04-06 DIAGNOSIS — I25118 Atherosclerotic heart disease of native coronary artery with other forms of angina pectoris: Secondary | ICD-10-CM | POA: Diagnosis not present

## 2017-04-06 DIAGNOSIS — E7800 Pure hypercholesterolemia, unspecified: Secondary | ICD-10-CM | POA: Diagnosis not present

## 2017-04-06 DIAGNOSIS — I679 Cerebrovascular disease, unspecified: Secondary | ICD-10-CM | POA: Diagnosis not present

## 2017-04-06 DIAGNOSIS — I10 Essential (primary) hypertension: Secondary | ICD-10-CM

## 2017-04-06 DIAGNOSIS — I6522 Occlusion and stenosis of left carotid artery: Secondary | ICD-10-CM

## 2017-04-06 NOTE — Patient Instructions (Addendum)

## 2017-04-09 NOTE — Addendum Note (Signed)
Addended by: Valora Corporal on: 04/09/2017 07:46 AM   Modules accepted: Orders

## 2017-04-22 DIAGNOSIS — H401113 Primary open-angle glaucoma, right eye, severe stage: Secondary | ICD-10-CM | POA: Diagnosis not present

## 2017-07-01 ENCOUNTER — Other Ambulatory Visit: Payer: Self-pay

## 2017-07-01 DIAGNOSIS — I10 Essential (primary) hypertension: Secondary | ICD-10-CM

## 2017-07-01 MED ORDER — LISINOPRIL 5 MG PO TABS: 5.0000 mg | ORAL_TABLET | Freq: Every day | ORAL | 0 refills | Status: DC

## 2017-07-09 ENCOUNTER — Other Ambulatory Visit: Payer: Self-pay | Admitting: *Deleted

## 2017-07-09 DIAGNOSIS — I6523 Occlusion and stenosis of bilateral carotid arteries: Secondary | ICD-10-CM

## 2017-07-19 ENCOUNTER — Ambulatory Visit (INDEPENDENT_AMBULATORY_CARE_PROVIDER_SITE_OTHER): Payer: Medicare HMO | Admitting: Family Medicine

## 2017-07-19 ENCOUNTER — Encounter: Payer: Self-pay | Admitting: Family Medicine

## 2017-07-19 VITALS — BP 122/80 | HR 68 | Ht 69.0 in | Wt 164.0 lb

## 2017-07-19 DIAGNOSIS — F3341 Major depressive disorder, recurrent, in partial remission: Secondary | ICD-10-CM | POA: Diagnosis not present

## 2017-07-19 DIAGNOSIS — E782 Mixed hyperlipidemia: Secondary | ICD-10-CM | POA: Diagnosis not present

## 2017-07-19 DIAGNOSIS — I10 Essential (primary) hypertension: Secondary | ICD-10-CM

## 2017-07-19 DIAGNOSIS — R69 Illness, unspecified: Secondary | ICD-10-CM | POA: Diagnosis not present

## 2017-07-19 DIAGNOSIS — Z23 Encounter for immunization: Secondary | ICD-10-CM

## 2017-07-19 DIAGNOSIS — N400 Enlarged prostate without lower urinary tract symptoms: Secondary | ICD-10-CM | POA: Insufficient documentation

## 2017-07-19 DIAGNOSIS — I679 Cerebrovascular disease, unspecified: Secondary | ICD-10-CM

## 2017-07-19 MED ORDER — FINASTERIDE 5 MG PO TABS: 5.0000 mg | ORAL_TABLET | Freq: Every day | ORAL | 1 refills | Status: DC

## 2017-07-19 MED ORDER — LISINOPRIL 5 MG PO TABS: 5.0000 mg | ORAL_TABLET | Freq: Every day | ORAL | 0 refills | Status: DC

## 2017-07-19 MED ORDER — METOPROLOL TARTRATE 25 MG PO TABS: 25.0000 mg | ORAL_TABLET | Freq: Two times a day (BID) | ORAL | 1 refills | Status: DC

## 2017-07-19 MED ORDER — ASPIRIN 81 MG PO TABS: 81.0000 mg | ORAL_TABLET | Freq: Every day | ORAL | 11 refills | Status: DC

## 2017-07-19 MED ORDER — SERTRALINE HCL 25 MG PO TABS: 25.0000 mg | ORAL_TABLET | Freq: Every day | ORAL | 1 refills | Status: DC

## 2017-07-19 MED ORDER — CLOPIDOGREL BISULFATE 75 MG PO TABS: 75.0000 mg | ORAL_TABLET | Freq: Every day | ORAL | 1 refills | Status: DC

## 2017-07-19 MED ORDER — LOVASTATIN 40 MG PO TABS: 40.0000 mg | ORAL_TABLET | Freq: Every day | ORAL | 1 refills | Status: DC
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
## 2017-07-20 ENCOUNTER — Ambulatory Visit (INDEPENDENT_AMBULATORY_CARE_PROVIDER_SITE_OTHER): Payer: Medicare HMO

## 2017-07-20 DIAGNOSIS — I6523 Occlusion and stenosis of bilateral carotid arteries: Secondary | ICD-10-CM | POA: Diagnosis not present

## 2017-07-20 LAB — VAS US CAROTID
LCCADSYS: -57 cm/s
LEFT ECA DIAS: -9 cm/s
Left CCA dist dias: -8 cm/s
Left CCA prox dias: 14 cm/s
Left CCA prox sys: 77 cm/s
Left ICA dist dias: -12 cm/s
Left ICA dist sys: -52 cm/s
Left ICA prox dias: -12 cm/s
Left ICA prox sys: -69 cm/s
RCCADSYS: -77 cm/s
RCCAPDIAS: 9 cm/s
RIGHT ECA DIAS: -7 cm/s
RIGHT VERTEBRAL DIAS: -15 cm/s
Right CCA prox sys: 41 cm/s

## 2017-07-27 ENCOUNTER — Other Ambulatory Visit: Payer: Self-pay | Admitting: *Deleted

## 2017-07-27 DIAGNOSIS — I6523 Occlusion and stenosis of bilateral carotid arteries: Secondary | ICD-10-CM

## 2017-08-06 ENCOUNTER — Other Ambulatory Visit: Payer: Self-pay

## 2017-08-20 ENCOUNTER — Other Ambulatory Visit: Payer: Self-pay

## 2017-08-26 ENCOUNTER — Ambulatory Visit: Payer: Medicare HMO | Admitting: Family Medicine

## 2017-10-19 ENCOUNTER — Other Ambulatory Visit: Payer: Self-pay

## 2017-10-19 ENCOUNTER — Ambulatory Visit (INDEPENDENT_AMBULATORY_CARE_PROVIDER_SITE_OTHER): Payer: Medicare HMO | Admitting: Family Medicine

## 2017-10-19 ENCOUNTER — Encounter: Payer: Self-pay | Admitting: Family Medicine

## 2017-10-19 VITALS — BP 130/64 | HR 80 | Ht 69.0 in | Wt 166.0 lb

## 2017-10-19 DIAGNOSIS — R404 Transient alteration of awareness: Secondary | ICD-10-CM

## 2017-10-19 LAB — POCT URINALYSIS DIPSTICK
Bilirubin, UA: NEGATIVE
Glucose, UA: NEGATIVE
KETONES UA: NEGATIVE
Leukocytes, UA: NEGATIVE
NITRITE UA: NEGATIVE
PH UA: 6 (ref 5.0–8.0)
RBC UA: NEGATIVE
Spec Grav, UA: 1.02 (ref 1.010–1.025)
UROBILINOGEN UA: 4 U/dL — AB
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
## 2017-10-20 DIAGNOSIS — H401113 Primary open-angle glaucoma, right eye, severe stage: Secondary | ICD-10-CM | POA: Diagnosis not present

## 2017-10-20 LAB — RENAL FUNCTION PANEL
ALBUMIN: 3.8 g/dL (ref 3.5–4.7)
BUN / CREAT RATIO: 15 (ref 10–24)
BUN: 17 mg/dL (ref 8–27)
CHLORIDE: 105 mmol/L (ref 96–106)
CO2: 23 mmol/L (ref 20–29)
Calcium: 9.3 mg/dL (ref 8.6–10.2)
Creatinine, Ser: 1.14 mg/dL (ref 0.76–1.27)
GFR calc non Af Amer: 58 mL/min/{1.73_m2} — ABNORMAL LOW (ref >59)
GFR, EST AFRICAN AMERICAN: 67 mL/min/{1.73_m2} (ref >59)
GLUCOSE: 102 mg/dL — AB (ref 65–99)
POTASSIUM: 4.5 mmol/L (ref 3.5–5.2)
Phosphorus: 3 mg/dL (ref 2.5–4.5)
SODIUM: 142 mmol/L (ref 134–144)

## 2017-10-20 LAB — CBC WITH DIFFERENTIAL/PLATELET
BASOS ABS: 0.1 10*3/uL (ref 0.0–0.2)
BASOS: 1 %
EOS (ABSOLUTE): 0.1 10*3/uL (ref 0.0–0.4)
Eos: 1 %
HEMATOCRIT: 43.3 % (ref 37.5–51.0)
Hemoglobin: 14 g/dL (ref 13.0–17.7)
Immature Grans (Abs): 0 10*3/uL (ref 0.0–0.1)
Immature Granulocytes: 1 %
LYMPHS ABS: 2.6 10*3/uL (ref 0.7–3.1)
Lymphs: 32 %
MCH: 28.1 pg (ref 26.6–33.0)
MCHC: 32.3 g/dL (ref 31.5–35.7)
MCV: 87 fL (ref 79–97)
MONOS ABS: 1.8 10*3/uL — AB (ref 0.1–0.9)
Monocytes: 22 %
Neutrophils Absolute: 3.5 10*3/uL (ref 1.4–7.0)
Neutrophils: 43 %
Platelets: 282 10*3/uL (ref 150–379)
RBC: 4.99 x10E6/uL (ref 4.14–5.80)
RDW: 18.6 % — ABNORMAL HIGH (ref 12.3–15.4)
WBC: 8 10*3/uL (ref 3.4–10.8)

## 2017-10-20 LAB — HEPATIC FUNCTION PANEL (6)
ALT: 15 IU/L (ref 0–44)
AST: 20 IU/L (ref 0–40)
Alkaline Phosphatase: 104 IU/L (ref 39–117)
Bilirubin Total: 0.3 mg/dL (ref 0.0–1.2)
Bilirubin, Direct: 0.09 mg/dL (ref 0.00–0.40)

## 2017-11-02 ENCOUNTER — Other Ambulatory Visit: Payer: Self-pay | Admitting: Family Medicine

## 2017-11-02 DIAGNOSIS — I10 Essential (primary) hypertension: Secondary | ICD-10-CM

## 2017-12-18 ENCOUNTER — Encounter: Payer: Self-pay | Admitting: Emergency Medicine

## 2017-12-18 ENCOUNTER — Emergency Department: Payer: Medicare HMO

## 2017-12-18 ENCOUNTER — Other Ambulatory Visit: Payer: Self-pay

## 2017-12-18 ENCOUNTER — Emergency Department
Admission: EM | Admit: 2017-12-18 | Discharge: 2017-12-18 | Disposition: A | Discharge status: Home / Self Care | Payer: Medicare HMO | Attending: Emergency Medicine | Admitting: Emergency Medicine

## 2017-12-18 DIAGNOSIS — Z7982 Long term (current) use of aspirin: Secondary | ICD-10-CM | POA: Insufficient documentation

## 2017-12-18 DIAGNOSIS — J439 Emphysema, unspecified: Secondary | ICD-10-CM | POA: Diagnosis not present

## 2017-12-18 DIAGNOSIS — Z87891 Personal history of nicotine dependence: Secondary | ICD-10-CM | POA: Diagnosis not present

## 2017-12-18 DIAGNOSIS — Z79899 Other long term (current) drug therapy: Secondary | ICD-10-CM | POA: Diagnosis not present

## 2017-12-18 DIAGNOSIS — E860 Dehydration: Secondary | ICD-10-CM | POA: Diagnosis not present

## 2017-12-18 DIAGNOSIS — E785 Hyperlipidemia, unspecified: Secondary | ICD-10-CM | POA: Diagnosis not present

## 2017-12-18 DIAGNOSIS — I2510 Atherosclerotic heart disease of native coronary artery without angina pectoris: Secondary | ICD-10-CM | POA: Diagnosis not present

## 2017-12-18 DIAGNOSIS — Z7902 Long term (current) use of antithrombotics/antiplatelets: Secondary | ICD-10-CM | POA: Insufficient documentation

## 2017-12-18 DIAGNOSIS — I10 Essential (primary) hypertension: Secondary | ICD-10-CM | POA: Insufficient documentation

## 2017-12-18 DIAGNOSIS — R55 Syncope and collapse: Secondary | ICD-10-CM | POA: Diagnosis not present

## 2017-12-18 DIAGNOSIS — R42 Dizziness and giddiness: Secondary | ICD-10-CM | POA: Diagnosis present

## 2017-12-18 DIAGNOSIS — R531 Weakness: Secondary | ICD-10-CM | POA: Diagnosis not present

## 2017-12-18 LAB — TROPONIN I: Troponin I: <0.03 ng/mL (ref <0.03)

## 2017-12-18 LAB — COMPREHENSIVE METABOLIC PANEL
ALBUMIN: 3.3 g/dL — AB (ref 3.5–5.0)
ALK PHOS: 92 U/L (ref 38–126)
ALT: 13 U/L (ref 0–44)
AST: 22 U/L (ref 15–41)
Anion gap: 9 (ref 5–15)
BILIRUBIN TOTAL: 0.6 mg/dL (ref 0.3–1.2)
BUN: 18 mg/dL (ref 8–23)
CALCIUM: 8.9 mg/dL (ref 8.9–10.3)
CO2: 23 mmol/L (ref 22–32)
Chloride: 106 mmol/L (ref 98–111)
Creatinine, Ser: 1.2 mg/dL (ref 0.61–1.24)
GFR calc Af Amer: >60 mL/min (ref >60)
GFR calc non Af Amer: 53 mL/min — ABNORMAL LOW (ref >60)
GLUCOSE: 112 mg/dL — AB (ref 70–99)
Potassium: 4 mmol/L (ref 3.5–5.1)
Sodium: 138 mmol/L (ref 135–145)
TOTAL PROTEIN: 7.1 g/dL (ref 6.5–8.1)

## 2017-12-18 LAB — URINALYSIS, COMPLETE (UACMP) WITH MICROSCOPIC
Bilirubin Urine: NEGATIVE
Glucose, UA: NEGATIVE mg/dL
Hgb urine dipstick: NEGATIVE
Ketones, ur: NEGATIVE mg/dL
Leukocytes, UA: NEGATIVE
Nitrite: NEGATIVE
PH: 5 (ref 5.0–8.0)
Protein, ur: 100 mg/dL — AB
SPECIFIC GRAVITY, URINE: 1.02 (ref 1.005–1.030)

## 2017-12-18 LAB — CBC
HCT: 39.5 % — ABNORMAL LOW (ref 40.0–52.0)
Hemoglobin: 13.5 g/dL (ref 13.0–18.0)
MCH: 29.5 pg (ref 26.0–34.0)
MCHC: 34.2 g/dL (ref 32.0–36.0)
MCV: 86.4 fL (ref 80.0–100.0)
PLATELETS: 269 10*3/uL (ref 150–440)
RBC: 4.58 MIL/uL (ref 4.40–5.90)
RDW: 18.7 % — ABNORMAL HIGH (ref 11.5–14.5)
WBC: 7.7 10*3/uL (ref 3.8–10.6)

## 2017-12-18 MED ORDER — SODIUM CHLORIDE 0.9 % IV BOLUS
500.0000 mL | Freq: Once | INTRAVENOUS | Status: AC
  Administered 2017-12-18: 500 mL via INTRAVENOUS

## 2017-12-18 NOTE — ED Triage Notes (Signed)
Caregiver states about 1 1/2 hour ago noticed he was diaphoretic and seemed weak. Now arrives with some confusion which caregiver states is more than usual.  Patient denies pain. Smile symmetrical, grips and leg strength equal.
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
## 2017-12-18 NOTE — Discharge Instructions (Addendum)
Return to the emergency room for weakness, or any other new or worrisome symptoms, make sure that stays with him tonight.  Drink plenty of fluids eat regularly, stay out of the heat and come back if you feel worse

## 2017-12-18 NOTE — ED Notes (Signed)
Pt given a lunch tray. 

## 2017-12-20 LAB — URINE CULTURE: Culture: <10000 — AB

## 2017-12-25 DIAGNOSIS — I2510 Atherosclerotic heart disease of native coronary artery without angina pectoris: Secondary | ICD-10-CM | POA: Diagnosis not present

## 2017-12-25 DIAGNOSIS — N400 Enlarged prostate without lower urinary tract symptoms: Secondary | ICD-10-CM | POA: Diagnosis not present

## 2017-12-25 DIAGNOSIS — F17200 Nicotine dependence, unspecified, uncomplicated: Secondary | ICD-10-CM | POA: Diagnosis not present

## 2017-12-25 DIAGNOSIS — E039 Hypothyroidism, unspecified: Secondary | ICD-10-CM | POA: Diagnosis not present

## 2017-12-25 DIAGNOSIS — R410 Disorientation, unspecified: Secondary | ICD-10-CM | POA: Diagnosis not present

## 2017-12-25 DIAGNOSIS — R4182 Altered mental status, unspecified: Secondary | ICD-10-CM | POA: Diagnosis not present

## 2017-12-25 DIAGNOSIS — F329 Major depressive disorder, single episode, unspecified: Secondary | ICD-10-CM | POA: Diagnosis not present

## 2017-12-25 DIAGNOSIS — R279 Unspecified lack of coordination: Secondary | ICD-10-CM | POA: Diagnosis not present

## 2017-12-25 DIAGNOSIS — M6281 Muscle weakness (generalized): Secondary | ICD-10-CM | POA: Diagnosis not present

## 2017-12-25 DIAGNOSIS — F05 Delirium due to known physiological condition: Secondary | ICD-10-CM | POA: Diagnosis not present

## 2017-12-25 DIAGNOSIS — F0150 Vascular dementia without behavioral disturbance: Secondary | ICD-10-CM | POA: Diagnosis not present

## 2017-12-25 DIAGNOSIS — R440 Auditory hallucinations: Secondary | ICD-10-CM | POA: Diagnosis not present

## 2017-12-25 DIAGNOSIS — I10 Essential (primary) hypertension: Secondary | ICD-10-CM | POA: Diagnosis not present

## 2017-12-25 DIAGNOSIS — R5381 Other malaise: Secondary | ICD-10-CM | POA: Diagnosis not present

## 2017-12-25 DIAGNOSIS — R69 Illness, unspecified: Secondary | ICD-10-CM | POA: Diagnosis not present

## 2017-12-25 DIAGNOSIS — I639 Cerebral infarction, unspecified: Secondary | ICD-10-CM | POA: Diagnosis not present

## 2017-12-25 DIAGNOSIS — I69319 Unspecified symptoms and signs involving cognitive functions following cerebral infarction: Secondary | ICD-10-CM | POA: Diagnosis not present

## 2017-12-25 DIAGNOSIS — H47619 Cortical blindness, unspecified side of brain: Secondary | ICD-10-CM | POA: Diagnosis not present

## 2017-12-26 DIAGNOSIS — R69 Illness, unspecified: Secondary | ICD-10-CM | POA: Diagnosis not present

## 2017-12-26 DIAGNOSIS — H47619 Cortical blindness, unspecified side of brain: Secondary | ICD-10-CM | POA: Diagnosis not present

## 2017-12-26 DIAGNOSIS — E039 Hypothyroidism, unspecified: Secondary | ICD-10-CM | POA: Diagnosis not present

## 2017-12-26 DIAGNOSIS — I10 Essential (primary) hypertension: Secondary | ICD-10-CM | POA: Diagnosis not present

## 2017-12-26 DIAGNOSIS — R410 Disorientation, unspecified: Secondary | ICD-10-CM | POA: Diagnosis not present

## 2017-12-26 DIAGNOSIS — R4182 Altered mental status, unspecified: Secondary | ICD-10-CM | POA: Diagnosis not present

## 2017-12-26 DIAGNOSIS — F05 Delirium due to known physiological condition: Secondary | ICD-10-CM | POA: Diagnosis not present

## 2017-12-26 DIAGNOSIS — I6529 Occlusion and stenosis of unspecified carotid artery: Secondary | ICD-10-CM | POA: Diagnosis not present

## 2017-12-26 DIAGNOSIS — I69319 Unspecified symptoms and signs involving cognitive functions following cerebral infarction: Secondary | ICD-10-CM | POA: Diagnosis not present

## 2017-12-26 DIAGNOSIS — N400 Enlarged prostate without lower urinary tract symptoms: Secondary | ICD-10-CM | POA: Diagnosis not present

## 2017-12-26 DIAGNOSIS — I639 Cerebral infarction, unspecified: Secondary | ICD-10-CM | POA: Diagnosis not present

## 2017-12-26 DIAGNOSIS — I2510 Atherosclerotic heart disease of native coronary artery without angina pectoris: Secondary | ICD-10-CM | POA: Diagnosis not present

## 2017-12-27 DIAGNOSIS — Z955 Presence of coronary angioplasty implant and graft: Secondary | ICD-10-CM | POA: Diagnosis not present

## 2017-12-27 DIAGNOSIS — I10 Essential (primary) hypertension: Secondary | ICD-10-CM | POA: Diagnosis not present

## 2017-12-27 DIAGNOSIS — I2510 Atherosclerotic heart disease of native coronary artery without angina pectoris: Secondary | ICD-10-CM | POA: Diagnosis not present

## 2017-12-27 DIAGNOSIS — R69 Illness, unspecified: Secondary | ICD-10-CM | POA: Diagnosis not present

## 2017-12-27 DIAGNOSIS — I639 Cerebral infarction, unspecified: Secondary | ICD-10-CM | POA: Diagnosis not present

## 2017-12-28 DIAGNOSIS — I349 Nonrheumatic mitral valve disorder, unspecified: Secondary | ICD-10-CM | POA: Diagnosis not present

## 2017-12-28 DIAGNOSIS — I639 Cerebral infarction, unspecified: Secondary | ICD-10-CM | POA: Diagnosis not present

## 2017-12-28 DIAGNOSIS — R69 Illness, unspecified: Secondary | ICD-10-CM | POA: Diagnosis not present

## 2017-12-28 DIAGNOSIS — I517 Cardiomegaly: Secondary | ICD-10-CM | POA: Diagnosis not present

## 2017-12-28 DIAGNOSIS — I340 Nonrheumatic mitral (valve) insufficiency: Secondary | ICD-10-CM | POA: Diagnosis not present

## 2017-12-28 DIAGNOSIS — H543 Unqualified visual loss, both eyes: Secondary | ICD-10-CM | POA: Diagnosis not present

## 2017-12-28 DIAGNOSIS — I2510 Atherosclerotic heart disease of native coronary artery without angina pectoris: Secondary | ICD-10-CM | POA: Diagnosis not present

## 2017-12-28 DIAGNOSIS — Z955 Presence of coronary angioplasty implant and graft: Secondary | ICD-10-CM | POA: Diagnosis not present

## 2017-12-28 DIAGNOSIS — I700 Atherosclerosis of aorta: Secondary | ICD-10-CM | POA: Diagnosis not present

## 2017-12-29 DIAGNOSIS — I639 Cerebral infarction, unspecified: Secondary | ICD-10-CM | POA: Diagnosis not present

## 2017-12-29 DIAGNOSIS — H543 Unqualified visual loss, both eyes: Secondary | ICD-10-CM | POA: Diagnosis not present

## 2017-12-29 DIAGNOSIS — Z955 Presence of coronary angioplasty implant and graft: Secondary | ICD-10-CM | POA: Diagnosis not present

## 2017-12-29 DIAGNOSIS — I10 Essential (primary) hypertension: Secondary | ICD-10-CM | POA: Diagnosis not present

## 2017-12-29 DIAGNOSIS — I2510 Atherosclerotic heart disease of native coronary artery without angina pectoris: Secondary | ICD-10-CM | POA: Diagnosis not present

## 2017-12-29 DIAGNOSIS — R69 Illness, unspecified: Secondary | ICD-10-CM | POA: Diagnosis not present

## 2017-12-30 DIAGNOSIS — Z955 Presence of coronary angioplasty implant and graft: Secondary | ICD-10-CM | POA: Diagnosis not present

## 2017-12-30 DIAGNOSIS — I639 Cerebral infarction, unspecified: Secondary | ICD-10-CM | POA: Diagnosis not present

## 2017-12-30 DIAGNOSIS — I2510 Atherosclerotic heart disease of native coronary artery without angina pectoris: Secondary | ICD-10-CM | POA: Diagnosis not present

## 2017-12-30 DIAGNOSIS — R69 Illness, unspecified: Secondary | ICD-10-CM | POA: Diagnosis not present

## 2017-12-30 DIAGNOSIS — I10 Essential (primary) hypertension: Secondary | ICD-10-CM | POA: Diagnosis not present

## 2017-12-30 DIAGNOSIS — H543 Unqualified visual loss, both eyes: Secondary | ICD-10-CM | POA: Diagnosis not present

## 2017-12-31 DIAGNOSIS — I10 Essential (primary) hypertension: Secondary | ICD-10-CM | POA: Diagnosis not present

## 2017-12-31 DIAGNOSIS — R69 Illness, unspecified: Secondary | ICD-10-CM | POA: Diagnosis not present

## 2017-12-31 DIAGNOSIS — I639 Cerebral infarction, unspecified: Secondary | ICD-10-CM | POA: Diagnosis not present

## 2017-12-31 DIAGNOSIS — H543 Unqualified visual loss, both eyes: Secondary | ICD-10-CM | POA: Diagnosis not present

## 2017-12-31 DIAGNOSIS — Z955 Presence of coronary angioplasty implant and graft: Secondary | ICD-10-CM | POA: Diagnosis not present

## 2017-12-31 DIAGNOSIS — I2510 Atherosclerotic heart disease of native coronary artery without angina pectoris: Secondary | ICD-10-CM | POA: Diagnosis not present

## 2018-01-01 DIAGNOSIS — I639 Cerebral infarction, unspecified: Secondary | ICD-10-CM | POA: Diagnosis not present

## 2018-01-01 DIAGNOSIS — R69 Illness, unspecified: Secondary | ICD-10-CM | POA: Diagnosis not present

## 2018-01-01 DIAGNOSIS — H543 Unqualified visual loss, both eyes: Secondary | ICD-10-CM | POA: Diagnosis not present

## 2018-01-01 DIAGNOSIS — I10 Essential (primary) hypertension: Secondary | ICD-10-CM | POA: Diagnosis not present

## 2018-01-02 DIAGNOSIS — H543 Unqualified visual loss, both eyes: Secondary | ICD-10-CM | POA: Diagnosis not present

## 2018-01-02 DIAGNOSIS — I639 Cerebral infarction, unspecified: Secondary | ICD-10-CM | POA: Diagnosis not present

## 2018-01-02 DIAGNOSIS — R69 Illness, unspecified: Secondary | ICD-10-CM | POA: Diagnosis not present

## 2018-01-02 DIAGNOSIS — I10 Essential (primary) hypertension: Secondary | ICD-10-CM | POA: Diagnosis not present

## 2018-01-03 DIAGNOSIS — R69 Illness, unspecified: Secondary | ICD-10-CM | POA: Diagnosis not present

## 2018-01-03 DIAGNOSIS — I10 Essential (primary) hypertension: Secondary | ICD-10-CM | POA: Diagnosis not present

## 2018-01-03 DIAGNOSIS — I2510 Atherosclerotic heart disease of native coronary artery without angina pectoris: Secondary | ICD-10-CM | POA: Diagnosis not present

## 2018-01-03 DIAGNOSIS — I639 Cerebral infarction, unspecified: Secondary | ICD-10-CM | POA: Diagnosis not present

## 2018-01-04 DIAGNOSIS — I2510 Atherosclerotic heart disease of native coronary artery without angina pectoris: Secondary | ICD-10-CM | POA: Diagnosis not present

## 2018-01-04 DIAGNOSIS — Z952 Presence of prosthetic heart valve: Secondary | ICD-10-CM | POA: Diagnosis not present

## 2018-01-04 DIAGNOSIS — I10 Essential (primary) hypertension: Secondary | ICD-10-CM | POA: Diagnosis not present

## 2018-01-04 DIAGNOSIS — I639 Cerebral infarction, unspecified: Secondary | ICD-10-CM | POA: Diagnosis not present

## 2018-01-04 DIAGNOSIS — R69 Illness, unspecified: Secondary | ICD-10-CM | POA: Diagnosis not present

## 2018-01-05 DIAGNOSIS — I2510 Atherosclerotic heart disease of native coronary artery without angina pectoris: Secondary | ICD-10-CM | POA: Diagnosis not present

## 2018-01-05 DIAGNOSIS — R69 Illness, unspecified: Secondary | ICD-10-CM | POA: Diagnosis not present

## 2018-01-05 DIAGNOSIS — I10 Essential (primary) hypertension: Secondary | ICD-10-CM | POA: Diagnosis not present

## 2018-01-05 DIAGNOSIS — I639 Cerebral infarction, unspecified: Secondary | ICD-10-CM | POA: Diagnosis not present

## 2018-01-06 DIAGNOSIS — E538 Deficiency of other specified B group vitamins: Secondary | ICD-10-CM | POA: Diagnosis not present

## 2018-01-06 DIAGNOSIS — R5381 Other malaise: Secondary | ICD-10-CM | POA: Diagnosis not present

## 2018-01-06 DIAGNOSIS — I69398 Other sequelae of cerebral infarction: Secondary | ICD-10-CM | POA: Diagnosis not present

## 2018-01-06 DIAGNOSIS — R69 Illness, unspecified: Secondary | ICD-10-CM | POA: Diagnosis not present

## 2018-01-06 DIAGNOSIS — R279 Unspecified lack of coordination: Secondary | ICD-10-CM | POA: Diagnosis not present

## 2018-01-06 DIAGNOSIS — H543 Unqualified visual loss, both eyes: Secondary | ICD-10-CM | POA: Diagnosis not present

## 2018-01-06 DIAGNOSIS — I6389 Other cerebral infarction: Secondary | ICD-10-CM | POA: Diagnosis not present

## 2018-01-06 DIAGNOSIS — I2510 Atherosclerotic heart disease of native coronary artery without angina pectoris: Secondary | ICD-10-CM | POA: Diagnosis not present

## 2018-01-06 DIAGNOSIS — I10 Essential (primary) hypertension: Secondary | ICD-10-CM | POA: Diagnosis not present

## 2018-01-06 DIAGNOSIS — R4182 Altered mental status, unspecified: Secondary | ICD-10-CM | POA: Diagnosis not present

## 2018-01-06 DIAGNOSIS — M6281 Muscle weakness (generalized): Secondary | ICD-10-CM | POA: Diagnosis not present

## 2018-01-06 DIAGNOSIS — R414 Neurologic neglect syndrome: Secondary | ICD-10-CM | POA: Diagnosis not present

## 2018-01-06 DIAGNOSIS — I639 Cerebral infarction, unspecified: Secondary | ICD-10-CM | POA: Diagnosis not present

## 2018-01-06 MED ORDER — SERTRALINE HCL 25 MG PO TABS
25.00 | ORAL_TABLET | ORAL | Status: DC
Start: 2018-01-07 — End: 2018-01-06

## 2018-01-06 MED ORDER — ENOXAPARIN SODIUM 40 MG/0.4ML ~~LOC~~ SOLN
40.00 | SUBCUTANEOUS | Status: DC
Start: 2018-01-07 — End: 2018-01-06

## 2018-01-06 MED ORDER — METOPROLOL TARTRATE 50 MG PO TABS
25.00 | ORAL_TABLET | ORAL | Status: DC
Start: 2018-01-06 — End: 2018-01-06

## 2018-01-06 MED ORDER — CLOPIDOGREL BISULFATE 75 MG PO TABS
75.00 | ORAL_TABLET | ORAL | Status: DC
Start: 2018-01-07 — End: 2018-01-06

## 2018-01-06 MED ORDER — ASPIRIN 81 MG PO CHEW
81.00 | CHEWABLE_TABLET | ORAL | Status: DC
Start: 2018-01-07 — End: 2018-01-06

## 2018-01-06 MED ORDER — GENERIC EXTERNAL MEDICATION
2.00 | Status: DC
Start: ? — End: 2018-01-06

## 2018-01-06 MED ORDER — LISINOPRIL 10 MG PO TABS
10.00 | ORAL_TABLET | ORAL | Status: DC
Start: 2018-01-07 — End: 2018-01-06

## 2018-01-06 MED ORDER — FINASTERIDE 5 MG PO TABS
5.00 | ORAL_TABLET | ORAL | Status: DC
Start: 2018-01-07 — End: 2018-01-06

## 2018-01-06 MED ORDER — ATORVASTATIN CALCIUM 20 MG PO TABS
40.00 | ORAL_TABLET | ORAL | Status: DC
Start: 2018-01-06 — End: 2018-01-06

## 2018-01-06 MED ORDER — POLYETHYLENE GLYCOL 3350 17 G PO PACK
17.00 | PACK | ORAL | Status: DC
Start: ? — End: 2018-01-06

## 2018-01-11 DIAGNOSIS — I6389 Other cerebral infarction: Secondary | ICD-10-CM | POA: Diagnosis not present

## 2018-01-11 DIAGNOSIS — R414 Neurologic neglect syndrome: Secondary | ICD-10-CM | POA: Diagnosis not present

## 2018-01-11 DIAGNOSIS — R5381 Other malaise: Secondary | ICD-10-CM | POA: Diagnosis not present

## 2018-01-11 DIAGNOSIS — I10 Essential (primary) hypertension: Secondary | ICD-10-CM | POA: Diagnosis not present

## 2018-01-17 ENCOUNTER — Telehealth: Payer: Self-pay | Admitting: Family Medicine

## 2018-01-17 ENCOUNTER — Ambulatory Visit: Payer: Medicare HMO | Admitting: Family Medicine

## 2018-01-17 NOTE — Telephone Encounter (Signed)
Called to schedule Medicare Annual Wellness Visit with Nurse Health Advisor. If patient returns call, please schedule AWV with NHA any date  Thank you! For any questions please contact: Jill Alexanders [phone]  Or Skype me at: [email or web address]

## 2018-01-18 DIAGNOSIS — I6389 Other cerebral infarction: Secondary | ICD-10-CM | POA: Diagnosis not present

## 2018-01-18 DIAGNOSIS — I10 Essential (primary) hypertension: Secondary | ICD-10-CM | POA: Diagnosis not present

## 2018-01-18 DIAGNOSIS — I2510 Atherosclerotic heart disease of native coronary artery without angina pectoris: Secondary | ICD-10-CM | POA: Diagnosis not present

## 2018-01-18 DIAGNOSIS — E538 Deficiency of other specified B group vitamins: Secondary | ICD-10-CM | POA: Diagnosis not present

## 2018-01-20 DIAGNOSIS — H5410 Blindness, one eye, low vision other eye, unspecified eyes: Secondary | ICD-10-CM | POA: Diagnosis not present

## 2018-01-20 DIAGNOSIS — I69398 Other sequelae of cerebral infarction: Secondary | ICD-10-CM | POA: Diagnosis not present

## 2018-01-20 DIAGNOSIS — I10 Essential (primary) hypertension: Secondary | ICD-10-CM | POA: Diagnosis not present

## 2018-01-20 DIAGNOSIS — R69 Illness, unspecified: Secondary | ICD-10-CM | POA: Diagnosis not present

## 2018-01-20 DIAGNOSIS — I739 Peripheral vascular disease, unspecified: Secondary | ICD-10-CM | POA: Diagnosis not present

## 2018-01-20 DIAGNOSIS — I2510 Atherosclerotic heart disease of native coronary artery without angina pectoris: Secondary | ICD-10-CM | POA: Diagnosis not present

## 2018-01-20 DIAGNOSIS — M6281 Muscle weakness (generalized): Secondary | ICD-10-CM | POA: Diagnosis not present

## 2018-01-20 DIAGNOSIS — N400 Enlarged prostate without lower urinary tract symptoms: Secondary | ICD-10-CM | POA: Diagnosis not present

## 2018-01-20 DIAGNOSIS — I69318 Other symptoms and signs involving cognitive functions following cerebral infarction: Secondary | ICD-10-CM | POA: Diagnosis not present

## 2018-01-24 ENCOUNTER — Telehealth: Payer: Self-pay

## 2018-01-24 ENCOUNTER — Encounter: Payer: Self-pay | Admitting: Family Medicine

## 2018-01-24 ENCOUNTER — Ambulatory Visit (INDEPENDENT_AMBULATORY_CARE_PROVIDER_SITE_OTHER): Payer: Medicare HMO | Admitting: Family Medicine

## 2018-01-24 VITALS — BP 128/62 | HR 72 | Ht 69.0 in | Wt 168.0 lb

## 2018-01-24 DIAGNOSIS — I679 Cerebrovascular disease, unspecified: Secondary | ICD-10-CM | POA: Diagnosis not present

## 2018-01-24 DIAGNOSIS — D649 Anemia, unspecified: Secondary | ICD-10-CM | POA: Diagnosis not present

## 2018-01-24 DIAGNOSIS — N400 Enlarged prostate without lower urinary tract symptoms: Secondary | ICD-10-CM | POA: Diagnosis not present

## 2018-01-24 DIAGNOSIS — I10 Essential (primary) hypertension: Secondary | ICD-10-CM | POA: Diagnosis not present

## 2018-01-24 DIAGNOSIS — F3341 Major depressive disorder, recurrent, in partial remission: Secondary | ICD-10-CM

## 2018-01-24 DIAGNOSIS — R69 Illness, unspecified: Secondary | ICD-10-CM | POA: Diagnosis not present

## 2018-01-24 DIAGNOSIS — E782 Mixed hyperlipidemia: Secondary | ICD-10-CM

## 2018-01-24 DIAGNOSIS — Z09 Encounter for follow-up examination after completed treatment for conditions other than malignant neoplasm: Secondary | ICD-10-CM | POA: Diagnosis not present

## 2018-01-24 MED ORDER — METOPROLOL TARTRATE 25 MG PO TABS: 25.0000 mg | ORAL_TABLET | Freq: Two times a day (BID) | ORAL | 1 refills | Status: DC

## 2018-01-24 MED ORDER — CLOPIDOGREL BISULFATE 75 MG PO TABS: 75.0000 mg | ORAL_TABLET | Freq: Every day | ORAL | 1 refills | Status: DC

## 2018-01-24 MED ORDER — ATORVASTATIN CALCIUM 40 MG PO TABS: 40.0000 mg | ORAL_TABLET | Freq: Every day | ORAL | 1 refills | Status: DC

## 2018-01-24 MED ORDER — SERTRALINE HCL 25 MG PO TABS: 25.0000 mg | ORAL_TABLET | Freq: Every day | ORAL | 1 refills | Status: DC

## 2018-01-24 MED ORDER — FINASTERIDE 5 MG PO TABS: 5.0000 mg | ORAL_TABLET | Freq: Every day | ORAL | 1 refills | Status: DC

## 2018-01-24 MED ORDER — LISINOPRIL 10 MG PO TABS: 10.0000 mg | ORAL_TABLET | Freq: Every day | ORAL | 1 refills | Status: DC

## 2018-01-24 NOTE — Assessment & Plan Note (Signed)
Chronic Stable Continue plavix 75 mg.

## 2018-01-24 NOTE — Assessment & Plan Note (Signed)
Chronic Controlled continue lisinopril 10 mg daily and metoprolol 25 mg bid. Check renal panel.

## 2018-01-24 NOTE — Assessment & Plan Note (Signed)
Chronic Stable Continue finasteride 5 mg daily.

## 2018-01-24 NOTE — Telephone Encounter (Signed)
[name] called from [facility] wanting verbal orders for twice a week for 4 weeks therapy- gave orders. [phone]

## 2018-01-24 NOTE — Assessment & Plan Note (Signed)
Chronic Controlled Continue atorvastatin 40 mg daily . Check renal panel.

## 2018-01-24 NOTE — Assessment & Plan Note (Signed)
Chronic Stable Continue sertraline 25 mg q day .
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
## 2018-01-25 DIAGNOSIS — M6281 Muscle weakness (generalized): Secondary | ICD-10-CM | POA: Diagnosis not present

## 2018-01-25 DIAGNOSIS — R69 Illness, unspecified: Secondary | ICD-10-CM | POA: Diagnosis not present

## 2018-01-25 DIAGNOSIS — I69318 Other symptoms and signs involving cognitive functions following cerebral infarction: Secondary | ICD-10-CM | POA: Diagnosis not present

## 2018-01-25 DIAGNOSIS — I2510 Atherosclerotic heart disease of native coronary artery without angina pectoris: Secondary | ICD-10-CM | POA: Diagnosis not present

## 2018-01-25 DIAGNOSIS — I69398 Other sequelae of cerebral infarction: Secondary | ICD-10-CM | POA: Diagnosis not present

## 2018-01-25 DIAGNOSIS — H5410 Blindness, one eye, low vision other eye, unspecified eyes: Secondary | ICD-10-CM | POA: Diagnosis not present

## 2018-01-25 DIAGNOSIS — N400 Enlarged prostate without lower urinary tract symptoms: Secondary | ICD-10-CM | POA: Diagnosis not present

## 2018-01-25 DIAGNOSIS — I10 Essential (primary) hypertension: Secondary | ICD-10-CM | POA: Diagnosis not present

## 2018-01-25 DIAGNOSIS — I739 Peripheral vascular disease, unspecified: Secondary | ICD-10-CM | POA: Diagnosis not present

## 2018-01-25 LAB — RENAL FUNCTION PANEL
ALBUMIN: 3.4 g/dL — AB (ref 3.5–4.7)
BUN/Creatinine Ratio: 15 (ref 10–24)
BUN: 18 mg/dL (ref 8–27)
CO2: 22 mmol/L (ref 20–29)
Calcium: 9.3 mg/dL (ref 8.6–10.2)
Chloride: 103 mmol/L (ref 96–106)
Creatinine, Ser: 1.23 mg/dL (ref 0.76–1.27)
GFR calc Af Amer: 61 mL/min/{1.73_m2} (ref >59)
GFR, EST NON AFRICAN AMERICAN: 53 mL/min/{1.73_m2} — AB (ref >59)
Glucose: 94 mg/dL (ref 65–99)
PHOSPHORUS: 3.4 mg/dL (ref 2.5–4.5)
POTASSIUM: 4.1 mmol/L (ref 3.5–5.2)
SODIUM: 139 mmol/L (ref 134–144)

## 2018-01-25 LAB — HEMOGLOBIN: Hemoglobin: 13.6 g/dL (ref 13.0–17.7)

## 2018-01-26 DIAGNOSIS — I69318 Other symptoms and signs involving cognitive functions following cerebral infarction: Secondary | ICD-10-CM | POA: Diagnosis not present

## 2018-01-26 DIAGNOSIS — N400 Enlarged prostate without lower urinary tract symptoms: Secondary | ICD-10-CM | POA: Diagnosis not present

## 2018-01-26 DIAGNOSIS — I10 Essential (primary) hypertension: Secondary | ICD-10-CM | POA: Diagnosis not present

## 2018-01-26 DIAGNOSIS — I739 Peripheral vascular disease, unspecified: Secondary | ICD-10-CM | POA: Diagnosis not present

## 2018-01-26 DIAGNOSIS — M6281 Muscle weakness (generalized): Secondary | ICD-10-CM | POA: Diagnosis not present

## 2018-01-26 DIAGNOSIS — I2510 Atherosclerotic heart disease of native coronary artery without angina pectoris: Secondary | ICD-10-CM | POA: Diagnosis not present

## 2018-01-26 DIAGNOSIS — H5410 Blindness, one eye, low vision other eye, unspecified eyes: Secondary | ICD-10-CM | POA: Diagnosis not present

## 2018-01-26 DIAGNOSIS — I69398 Other sequelae of cerebral infarction: Secondary | ICD-10-CM | POA: Diagnosis not present

## 2018-01-26 DIAGNOSIS — R69 Illness, unspecified: Secondary | ICD-10-CM | POA: Diagnosis not present

## 2018-01-27 ENCOUNTER — Telehealth: Payer: Self-pay

## 2018-01-27 DIAGNOSIS — I10 Essential (primary) hypertension: Secondary | ICD-10-CM | POA: Diagnosis not present

## 2018-01-27 DIAGNOSIS — I739 Peripheral vascular disease, unspecified: Secondary | ICD-10-CM | POA: Diagnosis not present

## 2018-01-27 DIAGNOSIS — N400 Enlarged prostate without lower urinary tract symptoms: Secondary | ICD-10-CM | POA: Diagnosis not present

## 2018-01-27 DIAGNOSIS — I69398 Other sequelae of cerebral infarction: Secondary | ICD-10-CM | POA: Diagnosis not present

## 2018-01-27 DIAGNOSIS — I69318 Other symptoms and signs involving cognitive functions following cerebral infarction: Secondary | ICD-10-CM | POA: Diagnosis not present

## 2018-01-27 DIAGNOSIS — I2510 Atherosclerotic heart disease of native coronary artery without angina pectoris: Secondary | ICD-10-CM | POA: Diagnosis not present

## 2018-01-27 DIAGNOSIS — M6281 Muscle weakness (generalized): Secondary | ICD-10-CM | POA: Diagnosis not present

## 2018-01-27 DIAGNOSIS — H5410 Blindness, one eye, low vision other eye, unspecified eyes: Secondary | ICD-10-CM | POA: Diagnosis not present

## 2018-01-27 DIAGNOSIS — R69 Illness, unspecified: Secondary | ICD-10-CM | POA: Diagnosis not present

## 2018-01-27 NOTE — Telephone Encounter (Signed)
Ann from [facility] called for orders speech therapy 1 time a week for 4 weeks

## 2018-01-28 DIAGNOSIS — R69 Illness, unspecified: Secondary | ICD-10-CM | POA: Diagnosis not present

## 2018-01-28 DIAGNOSIS — I739 Peripheral vascular disease, unspecified: Secondary | ICD-10-CM | POA: Diagnosis not present

## 2018-01-28 DIAGNOSIS — M6281 Muscle weakness (generalized): Secondary | ICD-10-CM | POA: Diagnosis not present

## 2018-01-28 DIAGNOSIS — I2510 Atherosclerotic heart disease of native coronary artery without angina pectoris: Secondary | ICD-10-CM | POA: Diagnosis not present

## 2018-01-28 DIAGNOSIS — H5410 Blindness, one eye, low vision other eye, unspecified eyes: Secondary | ICD-10-CM | POA: Diagnosis not present

## 2018-01-28 DIAGNOSIS — I10 Essential (primary) hypertension: Secondary | ICD-10-CM | POA: Diagnosis not present

## 2018-01-28 DIAGNOSIS — N400 Enlarged prostate without lower urinary tract symptoms: Secondary | ICD-10-CM | POA: Diagnosis not present

## 2018-01-28 DIAGNOSIS — I69398 Other sequelae of cerebral infarction: Secondary | ICD-10-CM | POA: Diagnosis not present

## 2018-01-28 DIAGNOSIS — I69318 Other symptoms and signs involving cognitive functions following cerebral infarction: Secondary | ICD-10-CM | POA: Diagnosis not present

## 2018-02-01 ENCOUNTER — Ambulatory Visit: Payer: Medicare HMO | Admitting: Family Medicine

## 2018-02-01 DIAGNOSIS — I69318 Other symptoms and signs involving cognitive functions following cerebral infarction: Secondary | ICD-10-CM | POA: Diagnosis not present

## 2018-02-01 DIAGNOSIS — H5410 Blindness, one eye, low vision other eye, unspecified eyes: Secondary | ICD-10-CM | POA: Diagnosis not present

## 2018-02-01 DIAGNOSIS — I2510 Atherosclerotic heart disease of native coronary artery without angina pectoris: Secondary | ICD-10-CM | POA: Diagnosis not present

## 2018-02-01 DIAGNOSIS — R69 Illness, unspecified: Secondary | ICD-10-CM | POA: Diagnosis not present

## 2018-02-01 DIAGNOSIS — N400 Enlarged prostate without lower urinary tract symptoms: Secondary | ICD-10-CM | POA: Diagnosis not present

## 2018-02-01 DIAGNOSIS — I10 Essential (primary) hypertension: Secondary | ICD-10-CM | POA: Diagnosis not present

## 2018-02-01 DIAGNOSIS — I69398 Other sequelae of cerebral infarction: Secondary | ICD-10-CM | POA: Diagnosis not present

## 2018-02-01 DIAGNOSIS — I739 Peripheral vascular disease, unspecified: Secondary | ICD-10-CM | POA: Diagnosis not present

## 2018-02-01 DIAGNOSIS — M6281 Muscle weakness (generalized): Secondary | ICD-10-CM | POA: Diagnosis not present

## 2018-02-02 DIAGNOSIS — I739 Peripheral vascular disease, unspecified: Secondary | ICD-10-CM | POA: Diagnosis not present

## 2018-02-02 DIAGNOSIS — I69318 Other symptoms and signs involving cognitive functions following cerebral infarction: Secondary | ICD-10-CM | POA: Diagnosis not present

## 2018-02-02 DIAGNOSIS — N400 Enlarged prostate without lower urinary tract symptoms: Secondary | ICD-10-CM | POA: Diagnosis not present

## 2018-02-02 DIAGNOSIS — I69398 Other sequelae of cerebral infarction: Secondary | ICD-10-CM | POA: Diagnosis not present

## 2018-02-02 DIAGNOSIS — H5410 Blindness, one eye, low vision other eye, unspecified eyes: Secondary | ICD-10-CM | POA: Diagnosis not present

## 2018-02-02 DIAGNOSIS — I2510 Atherosclerotic heart disease of native coronary artery without angina pectoris: Secondary | ICD-10-CM | POA: Diagnosis not present

## 2018-02-02 DIAGNOSIS — M6281 Muscle weakness (generalized): Secondary | ICD-10-CM | POA: Diagnosis not present

## 2018-02-02 DIAGNOSIS — I10 Essential (primary) hypertension: Secondary | ICD-10-CM | POA: Diagnosis not present

## 2018-02-02 DIAGNOSIS — R69 Illness, unspecified: Secondary | ICD-10-CM | POA: Diagnosis not present

## 2018-02-03 DIAGNOSIS — I2510 Atherosclerotic heart disease of native coronary artery without angina pectoris: Secondary | ICD-10-CM | POA: Diagnosis not present

## 2018-02-03 DIAGNOSIS — I10 Essential (primary) hypertension: Secondary | ICD-10-CM | POA: Diagnosis not present

## 2018-02-03 DIAGNOSIS — H5410 Blindness, one eye, low vision other eye, unspecified eyes: Secondary | ICD-10-CM | POA: Diagnosis not present

## 2018-02-03 DIAGNOSIS — M6281 Muscle weakness (generalized): Secondary | ICD-10-CM | POA: Diagnosis not present

## 2018-02-03 DIAGNOSIS — I69398 Other sequelae of cerebral infarction: Secondary | ICD-10-CM | POA: Diagnosis not present

## 2018-02-03 DIAGNOSIS — I739 Peripheral vascular disease, unspecified: Secondary | ICD-10-CM | POA: Diagnosis not present

## 2018-02-03 DIAGNOSIS — N400 Enlarged prostate without lower urinary tract symptoms: Secondary | ICD-10-CM | POA: Diagnosis not present

## 2018-02-03 DIAGNOSIS — I69318 Other symptoms and signs involving cognitive functions following cerebral infarction: Secondary | ICD-10-CM | POA: Diagnosis not present

## 2018-02-03 DIAGNOSIS — R69 Illness, unspecified: Secondary | ICD-10-CM | POA: Diagnosis not present

## 2018-02-08 DIAGNOSIS — H5410 Blindness, one eye, low vision other eye, unspecified eyes: Secondary | ICD-10-CM | POA: Diagnosis not present

## 2018-02-08 DIAGNOSIS — I69318 Other symptoms and signs involving cognitive functions following cerebral infarction: Secondary | ICD-10-CM | POA: Diagnosis not present

## 2018-02-08 DIAGNOSIS — I739 Peripheral vascular disease, unspecified: Secondary | ICD-10-CM | POA: Diagnosis not present

## 2018-02-08 DIAGNOSIS — M6281 Muscle weakness (generalized): Secondary | ICD-10-CM | POA: Diagnosis not present

## 2018-02-08 DIAGNOSIS — R69 Illness, unspecified: Secondary | ICD-10-CM | POA: Diagnosis not present

## 2018-02-08 DIAGNOSIS — I10 Essential (primary) hypertension: Secondary | ICD-10-CM | POA: Diagnosis not present

## 2018-02-08 DIAGNOSIS — I2510 Atherosclerotic heart disease of native coronary artery without angina pectoris: Secondary | ICD-10-CM | POA: Diagnosis not present

## 2018-02-08 DIAGNOSIS — N400 Enlarged prostate without lower urinary tract symptoms: Secondary | ICD-10-CM | POA: Diagnosis not present

## 2018-02-08 DIAGNOSIS — I69398 Other sequelae of cerebral infarction: Secondary | ICD-10-CM | POA: Diagnosis not present

## 2018-02-10 DIAGNOSIS — H5410 Blindness, one eye, low vision other eye, unspecified eyes: Secondary | ICD-10-CM | POA: Diagnosis not present

## 2018-02-10 DIAGNOSIS — I739 Peripheral vascular disease, unspecified: Secondary | ICD-10-CM | POA: Diagnosis not present

## 2018-02-10 DIAGNOSIS — N400 Enlarged prostate without lower urinary tract symptoms: Secondary | ICD-10-CM | POA: Diagnosis not present

## 2018-02-10 DIAGNOSIS — M6281 Muscle weakness (generalized): Secondary | ICD-10-CM | POA: Diagnosis not present

## 2018-02-10 DIAGNOSIS — R69 Illness, unspecified: Secondary | ICD-10-CM | POA: Diagnosis not present

## 2018-02-10 DIAGNOSIS — I69398 Other sequelae of cerebral infarction: Secondary | ICD-10-CM | POA: Diagnosis not present

## 2018-02-10 DIAGNOSIS — I10 Essential (primary) hypertension: Secondary | ICD-10-CM | POA: Diagnosis not present

## 2018-02-10 DIAGNOSIS — I2510 Atherosclerotic heart disease of native coronary artery without angina pectoris: Secondary | ICD-10-CM | POA: Diagnosis not present

## 2018-02-10 DIAGNOSIS — I69318 Other symptoms and signs involving cognitive functions following cerebral infarction: Secondary | ICD-10-CM | POA: Diagnosis not present

## 2018-02-11 ENCOUNTER — Telehealth: Payer: Self-pay

## 2018-02-11 NOTE — Telephone Encounter (Signed)
Systems analyst

## 2018-02-15 DIAGNOSIS — I69398 Other sequelae of cerebral infarction: Secondary | ICD-10-CM | POA: Diagnosis not present

## 2018-02-15 DIAGNOSIS — R69 Illness, unspecified: Secondary | ICD-10-CM | POA: Diagnosis not present

## 2018-02-15 DIAGNOSIS — M6281 Muscle weakness (generalized): Secondary | ICD-10-CM | POA: Diagnosis not present

## 2018-02-15 DIAGNOSIS — N400 Enlarged prostate without lower urinary tract symptoms: Secondary | ICD-10-CM | POA: Diagnosis not present

## 2018-02-15 DIAGNOSIS — I10 Essential (primary) hypertension: Secondary | ICD-10-CM | POA: Diagnosis not present

## 2018-02-15 DIAGNOSIS — I69318 Other symptoms and signs involving cognitive functions following cerebral infarction: Secondary | ICD-10-CM | POA: Diagnosis not present

## 2018-02-15 DIAGNOSIS — H5410 Blindness, one eye, low vision other eye, unspecified eyes: Secondary | ICD-10-CM | POA: Diagnosis not present

## 2018-02-15 DIAGNOSIS — I739 Peripheral vascular disease, unspecified: Secondary | ICD-10-CM | POA: Diagnosis not present

## 2018-02-15 DIAGNOSIS — I2510 Atherosclerotic heart disease of native coronary artery without angina pectoris: Secondary | ICD-10-CM | POA: Diagnosis not present

## 2018-02-16 DIAGNOSIS — H5410 Blindness, one eye, low vision other eye, unspecified eyes: Secondary | ICD-10-CM | POA: Diagnosis not present

## 2018-02-16 DIAGNOSIS — I69398 Other sequelae of cerebral infarction: Secondary | ICD-10-CM | POA: Diagnosis not present

## 2018-02-16 DIAGNOSIS — M6281 Muscle weakness (generalized): Secondary | ICD-10-CM | POA: Diagnosis not present

## 2018-02-16 DIAGNOSIS — R69 Illness, unspecified: Secondary | ICD-10-CM | POA: Diagnosis not present

## 2018-02-16 DIAGNOSIS — I739 Peripheral vascular disease, unspecified: Secondary | ICD-10-CM | POA: Diagnosis not present

## 2018-02-16 DIAGNOSIS — I69318 Other symptoms and signs involving cognitive functions following cerebral infarction: Secondary | ICD-10-CM | POA: Diagnosis not present

## 2018-02-16 DIAGNOSIS — I10 Essential (primary) hypertension: Secondary | ICD-10-CM | POA: Diagnosis not present

## 2018-02-16 DIAGNOSIS — N400 Enlarged prostate without lower urinary tract symptoms: Secondary | ICD-10-CM | POA: Diagnosis not present

## 2018-02-16 DIAGNOSIS — I2510 Atherosclerotic heart disease of native coronary artery without angina pectoris: Secondary | ICD-10-CM | POA: Diagnosis not present

## 2018-02-24 ENCOUNTER — Other Ambulatory Visit: Payer: Self-pay

## 2018-02-25 DIAGNOSIS — R82998 Other abnormal findings in urine: Secondary | ICD-10-CM | POA: Diagnosis not present

## 2018-02-25 DIAGNOSIS — R0982 Postnasal drip: Secondary | ICD-10-CM | POA: Diagnosis not present

## 2018-02-25 DIAGNOSIS — R509 Fever, unspecified: Secondary | ICD-10-CM | POA: Diagnosis not present

## 2018-02-25 DIAGNOSIS — R8290 Unspecified abnormal findings in urine: Secondary | ICD-10-CM | POA: Diagnosis not present

## 2018-02-25 DIAGNOSIS — J310 Chronic rhinitis: Secondary | ICD-10-CM | POA: Diagnosis not present

## 2018-03-04 ENCOUNTER — Telehealth: Payer: Self-pay

## 2018-03-04 ENCOUNTER — Telehealth: Payer: Self-pay | Admitting: Family Medicine

## 2018-03-04 NOTE — Telephone Encounter (Signed)
Ms carter talked to [employer] stated that if Mr Aggie Hacker needed in [place] that they can cover it with no cost through [employer]. She needs Dr Ronnald Ramp to send a approval for it.

## 2018-03-04 NOTE — Telephone Encounter (Signed)
Requesting [employer]ibility w [employer] for night time. Lafe Garin calling patient is home bound. OK to leave vm

## 2018-03-04 NOTE — Telephone Encounter (Signed)
Left message Baxter Flattery will address

## 2018-03-04 NOTE — Telephone Encounter (Signed)
Forwarded to [employer]

## 2018-03-07 ENCOUNTER — Other Ambulatory Visit: Payer: Self-pay

## 2018-03-07 DIAGNOSIS — R404 Transient alteration of awareness: Secondary | ICD-10-CM

## 2018-03-07 DIAGNOSIS — I679 Cerebrovascular disease, unspecified: Secondary | ICD-10-CM

## 2018-03-11 ENCOUNTER — Other Ambulatory Visit: Payer: Self-pay

## 2018-03-24 ENCOUNTER — Ambulatory Visit: Payer: Medicare HMO | Admitting: Family Medicine

## 2018-06-04 DIAGNOSIS — K5900 Constipation, unspecified: Secondary | ICD-10-CM | POA: Diagnosis not present

## 2018-06-04 DIAGNOSIS — I69328 Other speech and language deficits following cerebral infarction: Secondary | ICD-10-CM | POA: Diagnosis not present

## 2018-06-04 DIAGNOSIS — I10 Essential (primary) hypertension: Secondary | ICD-10-CM | POA: Diagnosis not present

## 2018-06-04 DIAGNOSIS — I25119 Atherosclerotic heart disease of native coronary artery with unspecified angina pectoris: Secondary | ICD-10-CM | POA: Diagnosis not present

## 2018-06-04 DIAGNOSIS — R69 Illness, unspecified: Secondary | ICD-10-CM | POA: Diagnosis not present

## 2018-06-04 DIAGNOSIS — I69319 Unspecified symptoms and signs involving cognitive functions following cerebral infarction: Secondary | ICD-10-CM | POA: Diagnosis not present

## 2018-06-04 DIAGNOSIS — E785 Hyperlipidemia, unspecified: Secondary | ICD-10-CM | POA: Diagnosis not present

## 2018-06-04 DIAGNOSIS — I739 Peripheral vascular disease, unspecified: Secondary | ICD-10-CM | POA: Diagnosis not present

## 2018-07-05 ENCOUNTER — Encounter: Payer: Self-pay | Admitting: Family Medicine

## 2018-07-05 ENCOUNTER — Ambulatory Visit (INDEPENDENT_AMBULATORY_CARE_PROVIDER_SITE_OTHER): Payer: Medicare HMO | Admitting: Family Medicine

## 2018-07-05 VITALS — BP 150/92 | HR 80 | Temp 98.1°F | Ht 69.0 in | Wt 170.0 lb

## 2018-07-05 DIAGNOSIS — R1110 Vomiting, unspecified: Secondary | ICD-10-CM | POA: Diagnosis not present

## 2018-07-05 DIAGNOSIS — I6930 Unspecified sequelae of cerebral infarction: Secondary | ICD-10-CM

## 2018-07-05 DIAGNOSIS — R918 Other nonspecific abnormal finding of lung field: Secondary | ICD-10-CM | POA: Diagnosis not present

## 2018-07-05 DIAGNOSIS — I10 Essential (primary) hypertension: Secondary | ICD-10-CM | POA: Diagnosis not present

## 2018-07-05 DIAGNOSIS — N400 Enlarged prostate without lower urinary tract symptoms: Secondary | ICD-10-CM | POA: Diagnosis not present

## 2018-07-05 DIAGNOSIS — I679 Cerebrovascular disease, unspecified: Secondary | ICD-10-CM | POA: Diagnosis not present

## 2018-07-05 DIAGNOSIS — Z8673 Personal history of transient ischemic attack (TIA), and cerebral infarction without residual deficits: Secondary | ICD-10-CM | POA: Diagnosis not present

## 2018-07-05 DIAGNOSIS — R441 Visual hallucinations: Secondary | ICD-10-CM | POA: Diagnosis not present

## 2018-07-05 DIAGNOSIS — H539 Unspecified visual disturbance: Secondary | ICD-10-CM | POA: Diagnosis not present

## 2018-07-05 DIAGNOSIS — J189 Pneumonia, unspecified organism: Secondary | ICD-10-CM | POA: Diagnosis not present

## 2018-07-05 DIAGNOSIS — R509 Fever, unspecified: Secondary | ICD-10-CM | POA: Diagnosis not present

## 2018-07-05 DIAGNOSIS — I2510 Atherosclerotic heart disease of native coronary artery without angina pectoris: Secondary | ICD-10-CM | POA: Diagnosis not present

## 2018-07-05 DIAGNOSIS — G4452 New daily persistent headache (NDPH): Secondary | ICD-10-CM

## 2018-07-05 DIAGNOSIS — R51 Headache: Secondary | ICD-10-CM | POA: Diagnosis not present

## 2018-07-05 DIAGNOSIS — H547 Unspecified visual loss: Secondary | ICD-10-CM | POA: Diagnosis not present

## 2018-07-05 DIAGNOSIS — R69 Illness, unspecified: Secondary | ICD-10-CM | POA: Diagnosis not present

## 2018-07-05 DIAGNOSIS — R4182 Altered mental status, unspecified: Secondary | ICD-10-CM | POA: Diagnosis not present

## 2018-07-05 DIAGNOSIS — E785 Hyperlipidemia, unspecified: Secondary | ICD-10-CM | POA: Diagnosis not present

## 2018-07-05 DIAGNOSIS — R410 Disorientation, unspecified: Secondary | ICD-10-CM | POA: Diagnosis not present

## 2018-07-05 NOTE — Progress Notes (Signed)
Date:  07/05/2018   Name:  [name]   DOB:  01-20-1930   MRN:  [id number]   Chief Complaint: Headache (c/o headache on R) side of face)  Headache   This is a new problem. The current episode started yesterday (noted last night). The problem occurs constantly. The problem has been gradually worsening. The pain is located in the right unilateral region. The pain does not radiate. The pain quality is similar to prior headaches. The quality of the pain is described as aching. The pain is at a severity of 6/10. The pain is moderate. Associated symptoms include blurred vision, coughing, muscle aches and a visual change. Pertinent negatives include no abdominal pain, abnormal behavior, anorexia, back pain, dizziness, ear pain, eye pain, eye redness, facial sweating, fever, hearing loss, nausea, neck pain, numbness, scalp tenderness, seizures, sinus pressure, sore throat or weakness. His past medical history is significant for hypertension.  Fever   This is a new problem. The current episode started yesterday (last night ). The maximum temperature noted was 103 to 103.9 F. Associated symptoms include coughing, headaches and muscle aches. Pertinent negatives include no abdominal pain, chest pain, congestion, diarrhea, ear pain, nausea, rash, sore throat, urinary pain or wheezing.    Review of Systems  Constitutional: Negative for chills and fever.  HENT: Negative for congestion, drooling, ear discharge, ear pain, hearing loss, sinus pressure and sore throat.   Eyes: Positive for blurred vision. Negative for pain and redness.  Respiratory: Positive for cough. Negative for shortness of breath and wheezing.   Cardiovascular: Negative for chest pain, palpitations and leg swelling.  Gastrointestinal: Negative for abdominal pain, anorexia, blood in stool, constipation, diarrhea and nausea.  Endocrine: Negative for polydipsia.  Genitourinary: Negative for dysuria, frequency, hematuria and  urgency.  Musculoskeletal: Negative for back pain, myalgias and neck pain.  Skin: Negative for rash.  Allergic/Immunologic: Negative for environmental allergies.  Neurological: Positive for headaches. Negative for dizziness, tremors, seizures, syncope, facial asymmetry, speech difficulty, weakness, light-headedness and numbness.  Hematological: Does not bruise/bleed easily.  Psychiatric/Behavioral: Negative for suicidal ideas. The patient is not nervous/anxious.     Patient Active Problem List   Diagnosis Date Noted  . Altered mental status, unspecified 12/26/2017  . Benign prostatic hyperplasia 07/19/2017  . Cerebral vascular disease 07/10/2015  . Vision loss 09/25/2014  . Depression 09/25/2014  . Coronary artery disease of native artery of native heart with stable angina pectoris ([place]) 09/25/2013  . Visit for pre-operative examination 09/23/2011  . Hypertension 09/23/2011  . Carotid stenosis 09/23/2011  . Hyperlipidemia 09/23/2011    No Known Allergies  Past Surgical History:  Procedure Laterality Date  . HERNIA REPAIR      Social History   Tobacco Use  . Smoking status: Former Smoker    Packs/day: 1.00    Years: 25.00    Pack years: 25.00    Types: Cigarettes    Last attempt to quit: 06/03/1923    Years since quitting: 95.1  . Smokeless tobacco: Never Used  Substance Use Topics  . Alcohol use: No  . Drug use: No     Medication list has been reviewed and updated.  Current Meds  Medication Sig  . acetaminophen (TYLENOL) 500 MG tablet Take 500 mg by mouth as needed.  Marland Kitchen atorvastatin (LIPITOR) 40 MG tablet Take 1 tablet (40 mg total) by mouth daily.  . clopidogrel (PLAVIX) 75 MG tablet Take 1 tablet (75 mg total) by mouth daily.  . cyanocobalamin  1000 MCG tablet Take 1,000 mcg by mouth daily.  . finasteride (PROSCAR) 5 MG tablet Take 1 tablet (5 mg total) by mouth daily.  Marland Kitchen lisinopril (PRINIVIL,ZESTRIL) 10 MG tablet Take 1 tablet (10 mg total) by mouth daily.  .  metoprolol tartrate (LOPRESSOR) 25 MG tablet Take 1 tablet (25 mg total) by mouth 2 (two) times daily.  . Multiple Vitamin (MULTIVITAMIN) tablet Take 1 tablet by mouth daily.  . sertraline (ZOLOFT) 25 MG tablet Take 1 tablet (25 mg total) by mouth daily.  . [DISCONTINUED] Cholecalciferol (VITAMIN D) 2000 units tablet Take 2,000 Units by mouth daily.    PHQ 2/9 Scores 01/24/2018 07/19/2017 11/26/2016 07/10/2015  PHQ - 2 Score 0 0 0 0  PHQ- 9 Score - 0 5 -    Physical Exam Vitals signs and nursing note reviewed.  Constitutional:      Appearance: He is normal weight.  HENT:     Head: Normocephalic.     Right Ear: External ear normal.     Left Ear: External ear normal.     Nose: Nose normal.     Mouth/Throat:     Mouth: Mucous membranes are moist.  Eyes:     General: No scleral icterus.       Right eye: No discharge.        Left eye: No discharge.     Extraocular Movements: Extraocular movements intact.     Conjunctiva/sclera: Conjunctivae normal.     Pupils: Pupils are equal, round, and reactive to light.  Neck:     Musculoskeletal: Normal range of motion and neck supple. No neck rigidity or muscular tenderness.     Thyroid: No thyromegaly.     Vascular: No JVD.     Trachea: No tracheal deviation.  Cardiovascular:     Rate and Rhythm: Normal rate and regular rhythm.     Heart sounds: Normal heart sounds. No murmur. No friction rub. No gallop.   Pulmonary:     Effort: No respiratory distress.     Breath sounds: Normal breath sounds. No wheezing or rales.  Abdominal:     General: Bowel sounds are normal.     Palpations: Abdomen is soft. There is no mass.     Tenderness: There is no abdominal tenderness. There is no guarding or rebound.  Musculoskeletal: Normal range of motion.        General: No tenderness.  Lymphadenopathy:     Cervical: No cervical adenopathy.  Skin:    General: Skin is warm.     Findings: No rash.  Neurological:     Mental Status: He is alert and oriented  to person, place, and time.     Cranial Nerves: No cranial nerve deficit.     Deep Tendon Reflexes: Reflexes are normal and symmetric.     BP (!) 150/92   Pulse 80   Temp 98.1 F (36.7 C)   Ht 5\' 9"  (1.753 m)   Wt 170 lb (77.1 kg)   BMI 25.10 kg/m   Assessment and Plan: 1. New daily persistent headache Patient has a persistent headache that began last night has been waxing and waning in nature and is only increasing in intensity.  2. Visual disturbance Patient has noted a further decrease in his visual disturbance from his previous CVA  3. History of CVA with residual deficit Patient has had a history of a CVA with residual deficit involving his vision which is worsened over the past 12 hours.  4. Fever  and chills Patient was noted to have a fever of 104 last night however is afebrile today  5. Cerebral vascular disease She has cerebrovascular disease history we will have him evaluated him in the emergency setting because of worsening of his neurologic exam specifically vision in the face of increasing right headache.

## 2018-07-06 DIAGNOSIS — I10 Essential (primary) hypertension: Secondary | ICD-10-CM | POA: Diagnosis not present

## 2018-07-06 DIAGNOSIS — E785 Hyperlipidemia, unspecified: Secondary | ICD-10-CM | POA: Diagnosis not present

## 2018-07-06 DIAGNOSIS — D72829 Elevated white blood cell count, unspecified: Secondary | ICD-10-CM | POA: Diagnosis not present

## 2018-07-06 DIAGNOSIS — R4182 Altered mental status, unspecified: Secondary | ICD-10-CM | POA: Diagnosis not present

## 2018-07-06 DIAGNOSIS — R410 Disorientation, unspecified: Secondary | ICD-10-CM | POA: Diagnosis not present

## 2018-07-06 DIAGNOSIS — R443 Hallucinations, unspecified: Secondary | ICD-10-CM | POA: Diagnosis not present

## 2018-07-06 DIAGNOSIS — J189 Pneumonia, unspecified organism: Secondary | ICD-10-CM | POA: Diagnosis not present

## 2018-07-06 DIAGNOSIS — R69 Illness, unspecified: Secondary | ICD-10-CM | POA: Diagnosis not present

## 2018-07-06 DIAGNOSIS — R1110 Vomiting, unspecified: Secondary | ICD-10-CM | POA: Diagnosis not present

## 2018-07-06 DIAGNOSIS — H547 Unspecified visual loss: Secondary | ICD-10-CM | POA: Diagnosis not present

## 2018-07-06 DIAGNOSIS — J168 Pneumonia due to other specified infectious organisms: Secondary | ICD-10-CM | POA: Diagnosis not present

## 2018-07-06 DIAGNOSIS — I25119 Atherosclerotic heart disease of native coronary artery with unspecified angina pectoris: Secondary | ICD-10-CM | POA: Diagnosis not present

## 2018-07-06 DIAGNOSIS — I4581 Long QT syndrome: Secondary | ICD-10-CM | POA: Diagnosis not present

## 2018-07-06 DIAGNOSIS — R441 Visual hallucinations: Secondary | ICD-10-CM | POA: Diagnosis not present

## 2018-07-06 DIAGNOSIS — F329 Major depressive disorder, single episode, unspecified: Secondary | ICD-10-CM | POA: Diagnosis not present

## 2018-07-07 DIAGNOSIS — R4182 Altered mental status, unspecified: Secondary | ICD-10-CM | POA: Diagnosis not present

## 2018-07-07 DIAGNOSIS — R51 Headache: Secondary | ICD-10-CM | POA: Diagnosis not present

## 2018-07-07 DIAGNOSIS — R918 Other nonspecific abnormal finding of lung field: Secondary | ICD-10-CM | POA: Diagnosis not present

## 2018-07-07 DIAGNOSIS — R441 Visual hallucinations: Secondary | ICD-10-CM | POA: Diagnosis not present

## 2018-07-07 MED ORDER — FINASTERIDE 5 MG PO TABS
5.00 | ORAL_TABLET | ORAL | Status: DC
Start: 2018-07-21 — End: 2018-07-07

## 2018-07-07 MED ORDER — CLOPIDOGREL BISULFATE 75 MG PO TABS
75.00 | ORAL_TABLET | ORAL | Status: DC
Start: 2018-07-21 — End: 2018-07-07

## 2018-07-07 MED ORDER — LISINOPRIL 5 MG PO TABS
5.00 | ORAL_TABLET | ORAL | Status: DC
Start: 2018-07-07 — End: 2018-07-07

## 2018-07-07 MED ORDER — ATORVASTATIN CALCIUM 20 MG PO TABS
40.00 | ORAL_TABLET | ORAL | Status: DC
Start: 2018-07-20 — End: 2018-07-07

## 2018-07-07 MED ORDER — ENOXAPARIN SODIUM 40 MG/0.4ML ~~LOC~~ SOLN
40.00 | SUBCUTANEOUS | Status: DC
Start: 2018-07-20 — End: 2018-07-07

## 2018-07-07 MED ORDER — METOPROLOL TARTRATE 25 MG PO TABS
25.00 | ORAL_TABLET | ORAL | Status: DC
Start: 2018-07-20 — End: 2018-07-07

## 2018-07-07 MED ORDER — GENERIC EXTERNAL MEDICATION
2.00 | Status: DC
Start: ? — End: 2018-07-07

## 2018-07-07 MED ORDER — GENERIC EXTERNAL MEDICATION
1.00 | Status: DC
Start: 2018-07-07 — End: 2018-07-07

## 2018-07-07 MED ORDER — AZITHROMYCIN 250 MG PO TABS
250.00 | ORAL_TABLET | ORAL | Status: DC
Start: 2018-07-07 — End: 2018-07-07

## 2018-07-07 MED ORDER — SERTRALINE HCL 25 MG PO TABS
25.00 | ORAL_TABLET | ORAL | Status: DC
Start: 2018-07-07 — End: 2018-07-07

## 2018-07-08 DIAGNOSIS — R4182 Altered mental status, unspecified: Secondary | ICD-10-CM | POA: Diagnosis not present

## 2018-07-08 DIAGNOSIS — R296 Repeated falls: Secondary | ICD-10-CM | POA: Diagnosis not present

## 2018-07-08 DIAGNOSIS — R69 Illness, unspecified: Secondary | ICD-10-CM | POA: Diagnosis not present

## 2018-07-08 DIAGNOSIS — R918 Other nonspecific abnormal finding of lung field: Secondary | ICD-10-CM | POA: Diagnosis not present

## 2018-07-08 DIAGNOSIS — I69998 Other sequelae following unspecified cerebrovascular disease: Secondary | ICD-10-CM | POA: Diagnosis not present

## 2018-07-09 DIAGNOSIS — I10 Essential (primary) hypertension: Secondary | ICD-10-CM | POA: Diagnosis not present

## 2018-07-09 DIAGNOSIS — R918 Other nonspecific abnormal finding of lung field: Secondary | ICD-10-CM | POA: Diagnosis not present

## 2018-07-09 DIAGNOSIS — I69998 Other sequelae following unspecified cerebrovascular disease: Secondary | ICD-10-CM | POA: Diagnosis not present

## 2018-07-09 DIAGNOSIS — R69 Illness, unspecified: Secondary | ICD-10-CM | POA: Diagnosis not present

## 2018-07-10 DIAGNOSIS — I10 Essential (primary) hypertension: Secondary | ICD-10-CM | POA: Diagnosis not present

## 2018-07-10 DIAGNOSIS — I69998 Other sequelae following unspecified cerebrovascular disease: Secondary | ICD-10-CM | POA: Diagnosis not present

## 2018-07-10 DIAGNOSIS — R69 Illness, unspecified: Secondary | ICD-10-CM | POA: Diagnosis not present

## 2018-07-11 DIAGNOSIS — I10 Essential (primary) hypertension: Secondary | ICD-10-CM | POA: Diagnosis not present

## 2018-07-11 DIAGNOSIS — I25118 Atherosclerotic heart disease of native coronary artery with other forms of angina pectoris: Secondary | ICD-10-CM | POA: Diagnosis not present

## 2018-07-11 DIAGNOSIS — R69 Illness, unspecified: Secondary | ICD-10-CM | POA: Diagnosis not present

## 2018-07-11 DIAGNOSIS — I69998 Other sequelae following unspecified cerebrovascular disease: Secondary | ICD-10-CM | POA: Diagnosis not present

## 2018-07-12 DIAGNOSIS — I69998 Other sequelae following unspecified cerebrovascular disease: Secondary | ICD-10-CM | POA: Diagnosis not present

## 2018-07-12 DIAGNOSIS — R69 Illness, unspecified: Secondary | ICD-10-CM | POA: Diagnosis not present

## 2018-07-12 DIAGNOSIS — I10 Essential (primary) hypertension: Secondary | ICD-10-CM | POA: Diagnosis not present

## 2018-07-12 DIAGNOSIS — I25118 Atherosclerotic heart disease of native coronary artery with other forms of angina pectoris: Secondary | ICD-10-CM | POA: Diagnosis not present

## 2018-07-13 DIAGNOSIS — I10 Essential (primary) hypertension: Secondary | ICD-10-CM | POA: Diagnosis not present

## 2018-07-13 DIAGNOSIS — I69998 Other sequelae following unspecified cerebrovascular disease: Secondary | ICD-10-CM | POA: Diagnosis not present

## 2018-07-13 DIAGNOSIS — R69 Illness, unspecified: Secondary | ICD-10-CM | POA: Diagnosis not present

## 2018-07-13 DIAGNOSIS — I25118 Atherosclerotic heart disease of native coronary artery with other forms of angina pectoris: Secondary | ICD-10-CM | POA: Diagnosis not present

## 2018-07-14 DIAGNOSIS — I25118 Atherosclerotic heart disease of native coronary artery with other forms of angina pectoris: Secondary | ICD-10-CM | POA: Diagnosis not present

## 2018-07-14 DIAGNOSIS — I10 Essential (primary) hypertension: Secondary | ICD-10-CM | POA: Diagnosis not present

## 2018-07-14 DIAGNOSIS — I69998 Other sequelae following unspecified cerebrovascular disease: Secondary | ICD-10-CM | POA: Diagnosis not present

## 2018-07-14 DIAGNOSIS — R69 Illness, unspecified: Secondary | ICD-10-CM | POA: Diagnosis not present

## 2018-07-15 DIAGNOSIS — I25118 Atherosclerotic heart disease of native coronary artery with other forms of angina pectoris: Secondary | ICD-10-CM | POA: Diagnosis not present

## 2018-07-15 DIAGNOSIS — I10 Essential (primary) hypertension: Secondary | ICD-10-CM | POA: Diagnosis not present

## 2018-07-15 DIAGNOSIS — R69 Illness, unspecified: Secondary | ICD-10-CM | POA: Diagnosis not present

## 2018-07-15 DIAGNOSIS — I69998 Other sequelae following unspecified cerebrovascular disease: Secondary | ICD-10-CM | POA: Diagnosis not present

## 2018-07-16 DIAGNOSIS — I10 Essential (primary) hypertension: Secondary | ICD-10-CM | POA: Diagnosis not present

## 2018-07-16 DIAGNOSIS — R69 Illness, unspecified: Secondary | ICD-10-CM | POA: Diagnosis not present

## 2018-07-16 DIAGNOSIS — I25110 Atherosclerotic heart disease of native coronary artery with unstable angina pectoris: Secondary | ICD-10-CM | POA: Diagnosis not present

## 2018-07-16 DIAGNOSIS — I69998 Other sequelae following unspecified cerebrovascular disease: Secondary | ICD-10-CM | POA: Diagnosis not present

## 2018-07-17 DIAGNOSIS — I10 Essential (primary) hypertension: Secondary | ICD-10-CM | POA: Diagnosis not present

## 2018-07-17 DIAGNOSIS — R69 Illness, unspecified: Secondary | ICD-10-CM | POA: Diagnosis not present

## 2018-07-17 DIAGNOSIS — I2510 Atherosclerotic heart disease of native coronary artery without angina pectoris: Secondary | ICD-10-CM | POA: Diagnosis not present

## 2018-07-17 DIAGNOSIS — I69398 Other sequelae of cerebral infarction: Secondary | ICD-10-CM | POA: Diagnosis not present

## 2018-07-18 DIAGNOSIS — I69398 Other sequelae of cerebral infarction: Secondary | ICD-10-CM | POA: Diagnosis not present

## 2018-07-18 DIAGNOSIS — I10 Essential (primary) hypertension: Secondary | ICD-10-CM | POA: Diagnosis not present

## 2018-07-18 DIAGNOSIS — R69 Illness, unspecified: Secondary | ICD-10-CM | POA: Diagnosis not present

## 2018-07-18 DIAGNOSIS — I2510 Atherosclerotic heart disease of native coronary artery without angina pectoris: Secondary | ICD-10-CM | POA: Diagnosis not present

## 2018-07-19 DIAGNOSIS — I10 Essential (primary) hypertension: Secondary | ICD-10-CM | POA: Diagnosis not present

## 2018-07-19 DIAGNOSIS — I2510 Atherosclerotic heart disease of native coronary artery without angina pectoris: Secondary | ICD-10-CM | POA: Diagnosis not present

## 2018-07-19 DIAGNOSIS — R69 Illness, unspecified: Secondary | ICD-10-CM | POA: Diagnosis not present

## 2018-07-19 DIAGNOSIS — I69398 Other sequelae of cerebral infarction: Secondary | ICD-10-CM | POA: Diagnosis not present

## 2018-07-20 DIAGNOSIS — I10 Essential (primary) hypertension: Secondary | ICD-10-CM | POA: Diagnosis not present

## 2018-07-20 DIAGNOSIS — R4182 Altered mental status, unspecified: Secondary | ICD-10-CM | POA: Diagnosis not present

## 2018-07-20 DIAGNOSIS — I69398 Other sequelae of cerebral infarction: Secondary | ICD-10-CM | POA: Diagnosis not present

## 2018-07-20 DIAGNOSIS — R69 Illness, unspecified: Secondary | ICD-10-CM | POA: Diagnosis not present

## 2018-07-20 MED ORDER — LISINOPRIL 20 MG PO TABS
40.00 | ORAL_TABLET | ORAL | Status: DC
Start: 2018-07-21 — End: 2018-07-20

## 2018-07-20 MED ORDER — MELATONIN 3 MG PO TABS
6.00 | ORAL_TABLET | ORAL | Status: DC
Start: 2018-07-20 — End: 2018-07-20

## 2018-07-20 MED ORDER — TRAZODONE HCL 50 MG PO TABS
50.00 | ORAL_TABLET | ORAL | Status: DC
Start: 2018-07-20 — End: 2018-07-20

## 2018-07-20 MED ORDER — POLYETHYLENE GLYCOL 3350 17 G PO PACK
17.00 | PACK | ORAL | Status: DC
Start: 2018-07-21 — End: 2018-07-20

## 2018-07-23 DIAGNOSIS — I25118 Atherosclerotic heart disease of native coronary artery with other forms of angina pectoris: Secondary | ICD-10-CM | POA: Diagnosis not present

## 2018-07-23 DIAGNOSIS — H9190 Unspecified hearing loss, unspecified ear: Secondary | ICD-10-CM | POA: Diagnosis not present

## 2018-07-23 DIAGNOSIS — Z7902 Long term (current) use of antithrombotics/antiplatelets: Secondary | ICD-10-CM | POA: Diagnosis not present

## 2018-07-23 DIAGNOSIS — I10 Essential (primary) hypertension: Secondary | ICD-10-CM | POA: Diagnosis not present

## 2018-07-23 DIAGNOSIS — E785 Hyperlipidemia, unspecified: Secondary | ICD-10-CM | POA: Diagnosis not present

## 2018-07-23 DIAGNOSIS — Z9181 History of falling: Secondary | ICD-10-CM | POA: Diagnosis not present

## 2018-07-23 DIAGNOSIS — Z955 Presence of coronary angioplasty implant and graft: Secondary | ICD-10-CM | POA: Diagnosis not present

## 2018-07-23 DIAGNOSIS — I69318 Other symptoms and signs involving cognitive functions following cerebral infarction: Secondary | ICD-10-CM | POA: Diagnosis not present

## 2018-07-23 DIAGNOSIS — I69398 Other sequelae of cerebral infarction: Secondary | ICD-10-CM | POA: Diagnosis not present

## 2018-07-23 DIAGNOSIS — I6522 Occlusion and stenosis of left carotid artery: Secondary | ICD-10-CM | POA: Diagnosis not present

## 2018-07-23 DIAGNOSIS — H47611 Cortical blindness, right side of brain: Secondary | ICD-10-CM | POA: Diagnosis not present

## 2018-07-29 ENCOUNTER — Ambulatory Visit (INDEPENDENT_AMBULATORY_CARE_PROVIDER_SITE_OTHER): Payer: Medicare Other | Admitting: Family Medicine

## 2018-07-29 ENCOUNTER — Encounter: Payer: Self-pay | Admitting: Family Medicine

## 2018-07-29 VITALS — BP 110/62 | HR 72 | Ht 69.0 in | Wt 159.0 lb

## 2018-07-29 DIAGNOSIS — F5101 Primary insomnia: Secondary | ICD-10-CM

## 2018-07-29 DIAGNOSIS — F3341 Major depressive disorder, recurrent, in partial remission: Secondary | ICD-10-CM | POA: Diagnosis not present

## 2018-07-29 MED ORDER — SERTRALINE HCL 25 MG PO TABS: 25.0000 mg | ORAL_TABLET | Freq: Every day | ORAL | 1 refills | Status: DC

## 2018-07-29 NOTE — Progress Notes (Signed)
Date:  07/29/2018   Name:  [name]   DOB:  04/28/1930   MRN:  [id number]   Chief Complaint: Follow-up (hospital follow up- admitted on 07/06/2018 and discharged on 07/20/2018. TOC call made. ) and Anxiety (wants sertraline increased to 50mg )  Anxiety  Presents for follow-up visit. Symptoms include chest pain, nervous/anxious behavior and panic. Patient reports no compulsions, confusion, decreased concentration, depressed mood, dizziness, dry mouth, excessive worry, feeling of choking, hyperventilation, impotence, insomnia, irritability, malaise, muscle tension, nausea, obsessions, palpitations, restlessness, shortness of breath or suicidal ideas. Symptoms occur most days. The severity of symptoms is mild. The quality of sleep is good (off trazadone). Nighttime awakenings: one to two.   Side effects of treatment include auditory problems.    Review of Systems  Constitutional: Negative for chills, fever and irritability.  HENT: Negative for drooling, ear discharge, ear pain and sore throat.   Respiratory: Negative for cough, shortness of breath and wheezing.   Cardiovascular: Positive for chest pain. Negative for palpitations and leg swelling.  Gastrointestinal: Negative for abdominal pain, blood in stool, constipation, diarrhea and nausea.  Endocrine: Negative for polydipsia.  Genitourinary: Negative for dysuria, frequency, hematuria, impotence and urgency.  Musculoskeletal: Negative for back pain, myalgias and neck pain.  Skin: Negative for rash.  Allergic/Immunologic: Negative for environmental allergies.  Neurological: Negative for dizziness and headaches.  Hematological: Does not bruise/bleed easily.  Psychiatric/Behavioral: Negative for confusion, decreased concentration and suicidal ideas. The patient is nervous/anxious. The patient does not have insomnia.     Patient Active Problem List   Diagnosis Date Noted  . Altered mental status, unspecified 12/26/2017  .  Benign prostatic hyperplasia 07/19/2017  . Cerebral vascular disease 07/10/2015  . Vision loss 09/25/2014  . Depression 09/25/2014  . Coronary artery disease of native artery of native heart with stable angina pectoris ([place]) 09/25/2013  . Visit for pre-operative examination 09/23/2011  . Hypertension 09/23/2011  . Carotid stenosis 09/23/2011  . Hyperlipidemia 09/23/2011    No Known Allergies  Past Surgical History:  Procedure Laterality Date  . HERNIA REPAIR      Social History   Tobacco Use  . Smoking status: Former Smoker    Packs/day: 1.00    Years: 25.00    Pack years: 25.00    Types: Cigarettes    Last attempt to quit: 06/03/1923    Years since quitting: 95.2  . Smokeless tobacco: Never Used  Substance Use Topics  . Alcohol use: No  . Drug use: No     Medication list has been reviewed and updated.  Current Meds  Medication Sig  . acetaminophen (TYLENOL) 500 MG tablet Take 500 mg by mouth as needed.  Marland Kitchen atorvastatin (LIPITOR) 40 MG tablet Take 1 tablet (40 mg total) by mouth daily.  . clopidogrel (PLAVIX) 75 MG tablet Take 1 tablet (75 mg total) by mouth daily.  . cyanocobalamin 1000 MCG tablet Take 1,000 mcg by mouth daily.  . finasteride (PROSCAR) 5 MG tablet Take 1 tablet (5 mg total) by mouth daily.  Marland Kitchen lisinopril (PRINIVIL,ZESTRIL) 10 MG tablet Take 1 tablet (10 mg total) by mouth daily.  . metoprolol tartrate (LOPRESSOR) 25 MG tablet Take 1 tablet (25 mg total) by mouth 2 (two) times daily.  . Multiple Vitamin (MULTIVITAMIN) tablet Take 1 tablet by mouth daily.  . sertraline (ZOLOFT) 25 MG tablet Take 1 tablet (25 mg total) by mouth daily.    PHQ 2/9 Scores 01/24/2018 07/19/2017 11/26/2016 07/10/2015  PHQ - 2  Score 0 0 0 0  PHQ- 9 Score - 0 5 -    Physical Exam Vitals signs and nursing note reviewed.  HENT:     Head: Normocephalic.     Right Ear: External ear normal.     Left Ear: External ear normal.     Nose: Nose normal.  Eyes:     General: No  scleral icterus.       Right eye: No discharge.        Left eye: No discharge.     Conjunctiva/sclera: Conjunctivae normal.     Pupils: Pupils are equal, round, and reactive to light.  Neck:     Musculoskeletal: Normal range of motion and neck supple.     Thyroid: No thyromegaly.     Vascular: No JVD.     Trachea: No tracheal deviation.  Cardiovascular:     Rate and Rhythm: Normal rate and regular rhythm.     Heart sounds: Normal heart sounds. No murmur. No friction rub. No gallop.   Pulmonary:     Effort: No respiratory distress.     Breath sounds: Normal breath sounds. No wheezing or rales.  Abdominal:     General: Bowel sounds are normal.     Palpations: Abdomen is soft. There is no mass.     Tenderness: There is no abdominal tenderness. There is no guarding or rebound.  Musculoskeletal: Normal range of motion.        General: No tenderness.  Lymphadenopathy:     Cervical: No cervical adenopathy.  Skin:    General: Skin is warm.     Findings: No rash.  Neurological:     Mental Status: He is alert and oriented to person, place, and time.     Cranial Nerves: No cranial nerve deficit.     Deep Tendon Reflexes: Reflexes are normal and symmetric.     BP 110/62   Pulse 72   Ht 5\' 9"  (1.753 m)   Wt 159 lb (72.1 kg)   BMI 23.48 kg/m   Assessment and Plan: 1. Recurrent major depressive disorder, in partial remission ([place]) And was tried on trazodone but this made him very active at night so caretaker has discontinued trazodone and resumed sertraline 25 mg and this seems to improve his disposition and sleep pattern significantly. - sertraline (ZOLOFT) 25 MG tablet; Take 1 tablet (25 mg total) by mouth daily.  Dispense: 90 tablet; Refill: 1  2. Primary insomnia Trazodone was stopped and patient will continue on sertraline.

## 2018-08-02 DIAGNOSIS — E785 Hyperlipidemia, unspecified: Secondary | ICD-10-CM | POA: Diagnosis not present

## 2018-08-02 DIAGNOSIS — H9190 Unspecified hearing loss, unspecified ear: Secondary | ICD-10-CM | POA: Diagnosis not present

## 2018-08-02 DIAGNOSIS — I69398 Other sequelae of cerebral infarction: Secondary | ICD-10-CM | POA: Diagnosis not present

## 2018-08-02 DIAGNOSIS — Z955 Presence of coronary angioplasty implant and graft: Secondary | ICD-10-CM | POA: Diagnosis not present

## 2018-08-02 DIAGNOSIS — Z9181 History of falling: Secondary | ICD-10-CM | POA: Diagnosis not present

## 2018-08-02 DIAGNOSIS — I10 Essential (primary) hypertension: Secondary | ICD-10-CM | POA: Diagnosis not present

## 2018-08-02 DIAGNOSIS — I25118 Atherosclerotic heart disease of native coronary artery with other forms of angina pectoris: Secondary | ICD-10-CM | POA: Diagnosis not present

## 2018-08-02 DIAGNOSIS — I6522 Occlusion and stenosis of left carotid artery: Secondary | ICD-10-CM | POA: Diagnosis not present

## 2018-08-02 DIAGNOSIS — Z7902 Long term (current) use of antithrombotics/antiplatelets: Secondary | ICD-10-CM | POA: Diagnosis not present

## 2018-08-02 DIAGNOSIS — H47611 Cortical blindness, right side of brain: Secondary | ICD-10-CM | POA: Diagnosis not present

## 2018-08-02 DIAGNOSIS — I69318 Other symptoms and signs involving cognitive functions following cerebral infarction: Secondary | ICD-10-CM | POA: Diagnosis not present

## 2018-08-03 DIAGNOSIS — Z9181 History of falling: Secondary | ICD-10-CM | POA: Diagnosis not present

## 2018-08-03 DIAGNOSIS — E785 Hyperlipidemia, unspecified: Secondary | ICD-10-CM | POA: Diagnosis not present

## 2018-08-03 DIAGNOSIS — I25118 Atherosclerotic heart disease of native coronary artery with other forms of angina pectoris: Secondary | ICD-10-CM | POA: Diagnosis not present

## 2018-08-03 DIAGNOSIS — H47611 Cortical blindness, right side of brain: Secondary | ICD-10-CM | POA: Diagnosis not present

## 2018-08-03 DIAGNOSIS — H9190 Unspecified hearing loss, unspecified ear: Secondary | ICD-10-CM | POA: Diagnosis not present

## 2018-08-03 DIAGNOSIS — I69398 Other sequelae of cerebral infarction: Secondary | ICD-10-CM | POA: Diagnosis not present

## 2018-08-03 DIAGNOSIS — Z955 Presence of coronary angioplasty implant and graft: Secondary | ICD-10-CM | POA: Diagnosis not present

## 2018-08-03 DIAGNOSIS — I10 Essential (primary) hypertension: Secondary | ICD-10-CM | POA: Diagnosis not present

## 2018-08-03 DIAGNOSIS — I69318 Other symptoms and signs involving cognitive functions following cerebral infarction: Secondary | ICD-10-CM | POA: Diagnosis not present

## 2018-08-03 DIAGNOSIS — Z7902 Long term (current) use of antithrombotics/antiplatelets: Secondary | ICD-10-CM | POA: Diagnosis not present

## 2018-08-03 DIAGNOSIS — I6522 Occlusion and stenosis of left carotid artery: Secondary | ICD-10-CM | POA: Diagnosis not present

## 2018-08-09 DIAGNOSIS — I69398 Other sequelae of cerebral infarction: Secondary | ICD-10-CM | POA: Diagnosis not present

## 2018-08-09 DIAGNOSIS — I69318 Other symptoms and signs involving cognitive functions following cerebral infarction: Secondary | ICD-10-CM | POA: Diagnosis not present

## 2018-08-09 DIAGNOSIS — H47611 Cortical blindness, right side of brain: Secondary | ICD-10-CM | POA: Diagnosis not present

## 2018-08-09 DIAGNOSIS — H9190 Unspecified hearing loss, unspecified ear: Secondary | ICD-10-CM | POA: Diagnosis not present

## 2018-08-09 DIAGNOSIS — Z955 Presence of coronary angioplasty implant and graft: Secondary | ICD-10-CM | POA: Diagnosis not present

## 2018-08-09 DIAGNOSIS — I6522 Occlusion and stenosis of left carotid artery: Secondary | ICD-10-CM | POA: Diagnosis not present

## 2018-08-09 DIAGNOSIS — E785 Hyperlipidemia, unspecified: Secondary | ICD-10-CM | POA: Diagnosis not present

## 2018-08-09 DIAGNOSIS — I25118 Atherosclerotic heart disease of native coronary artery with other forms of angina pectoris: Secondary | ICD-10-CM | POA: Diagnosis not present

## 2018-08-09 DIAGNOSIS — I10 Essential (primary) hypertension: Secondary | ICD-10-CM | POA: Diagnosis not present

## 2018-08-09 DIAGNOSIS — Z7902 Long term (current) use of antithrombotics/antiplatelets: Secondary | ICD-10-CM | POA: Diagnosis not present

## 2018-08-09 DIAGNOSIS — Z9181 History of falling: Secondary | ICD-10-CM | POA: Diagnosis not present

## 2018-08-10 DIAGNOSIS — H47611 Cortical blindness, right side of brain: Secondary | ICD-10-CM | POA: Diagnosis not present

## 2018-08-10 DIAGNOSIS — Z9181 History of falling: Secondary | ICD-10-CM | POA: Diagnosis not present

## 2018-08-10 DIAGNOSIS — I25118 Atherosclerotic heart disease of native coronary artery with other forms of angina pectoris: Secondary | ICD-10-CM | POA: Diagnosis not present

## 2018-08-10 DIAGNOSIS — E785 Hyperlipidemia, unspecified: Secondary | ICD-10-CM | POA: Diagnosis not present

## 2018-08-10 DIAGNOSIS — I6522 Occlusion and stenosis of left carotid artery: Secondary | ICD-10-CM | POA: Diagnosis not present

## 2018-08-10 DIAGNOSIS — Z955 Presence of coronary angioplasty implant and graft: Secondary | ICD-10-CM | POA: Diagnosis not present

## 2018-08-10 DIAGNOSIS — I10 Essential (primary) hypertension: Secondary | ICD-10-CM | POA: Diagnosis not present

## 2018-08-10 DIAGNOSIS — Z7902 Long term (current) use of antithrombotics/antiplatelets: Secondary | ICD-10-CM | POA: Diagnosis not present

## 2018-08-10 DIAGNOSIS — I69398 Other sequelae of cerebral infarction: Secondary | ICD-10-CM | POA: Diagnosis not present

## 2018-08-10 DIAGNOSIS — I69318 Other symptoms and signs involving cognitive functions following cerebral infarction: Secondary | ICD-10-CM | POA: Diagnosis not present

## 2018-08-10 DIAGNOSIS — H9190 Unspecified hearing loss, unspecified ear: Secondary | ICD-10-CM | POA: Diagnosis not present

## 2018-08-12 DIAGNOSIS — Z7902 Long term (current) use of antithrombotics/antiplatelets: Secondary | ICD-10-CM | POA: Diagnosis not present

## 2018-08-12 DIAGNOSIS — E785 Hyperlipidemia, unspecified: Secondary | ICD-10-CM | POA: Diagnosis not present

## 2018-08-12 DIAGNOSIS — H9190 Unspecified hearing loss, unspecified ear: Secondary | ICD-10-CM | POA: Diagnosis not present

## 2018-08-12 DIAGNOSIS — I25118 Atherosclerotic heart disease of native coronary artery with other forms of angina pectoris: Secondary | ICD-10-CM | POA: Diagnosis not present

## 2018-08-12 DIAGNOSIS — Z955 Presence of coronary angioplasty implant and graft: Secondary | ICD-10-CM | POA: Diagnosis not present

## 2018-08-12 DIAGNOSIS — I69398 Other sequelae of cerebral infarction: Secondary | ICD-10-CM | POA: Diagnosis not present

## 2018-08-12 DIAGNOSIS — I69318 Other symptoms and signs involving cognitive functions following cerebral infarction: Secondary | ICD-10-CM | POA: Diagnosis not present

## 2018-08-12 DIAGNOSIS — I10 Essential (primary) hypertension: Secondary | ICD-10-CM | POA: Diagnosis not present

## 2018-08-12 DIAGNOSIS — Z9181 History of falling: Secondary | ICD-10-CM | POA: Diagnosis not present

## 2018-08-12 DIAGNOSIS — I6522 Occlusion and stenosis of left carotid artery: Secondary | ICD-10-CM | POA: Diagnosis not present

## 2018-08-12 DIAGNOSIS — H47611 Cortical blindness, right side of brain: Secondary | ICD-10-CM | POA: Diagnosis not present

## 2018-08-16 DIAGNOSIS — I6522 Occlusion and stenosis of left carotid artery: Secondary | ICD-10-CM | POA: Diagnosis not present

## 2018-08-16 DIAGNOSIS — I69398 Other sequelae of cerebral infarction: Secondary | ICD-10-CM | POA: Diagnosis not present

## 2018-08-16 DIAGNOSIS — I25118 Atherosclerotic heart disease of native coronary artery with other forms of angina pectoris: Secondary | ICD-10-CM | POA: Diagnosis not present

## 2018-08-16 DIAGNOSIS — Z9181 History of falling: Secondary | ICD-10-CM | POA: Diagnosis not present

## 2018-08-16 DIAGNOSIS — I69318 Other symptoms and signs involving cognitive functions following cerebral infarction: Secondary | ICD-10-CM | POA: Diagnosis not present

## 2018-08-16 DIAGNOSIS — E785 Hyperlipidemia, unspecified: Secondary | ICD-10-CM | POA: Diagnosis not present

## 2018-08-16 DIAGNOSIS — I10 Essential (primary) hypertension: Secondary | ICD-10-CM | POA: Diagnosis not present

## 2018-08-16 DIAGNOSIS — Z7902 Long term (current) use of antithrombotics/antiplatelets: Secondary | ICD-10-CM | POA: Diagnosis not present

## 2018-08-16 DIAGNOSIS — Z955 Presence of coronary angioplasty implant and graft: Secondary | ICD-10-CM | POA: Diagnosis not present

## 2018-08-16 DIAGNOSIS — H47611 Cortical blindness, right side of brain: Secondary | ICD-10-CM | POA: Diagnosis not present

## 2018-08-16 DIAGNOSIS — H9190 Unspecified hearing loss, unspecified ear: Secondary | ICD-10-CM | POA: Diagnosis not present

## 2018-08-18 DIAGNOSIS — I69398 Other sequelae of cerebral infarction: Secondary | ICD-10-CM | POA: Diagnosis not present

## 2018-08-18 DIAGNOSIS — Z7902 Long term (current) use of antithrombotics/antiplatelets: Secondary | ICD-10-CM | POA: Diagnosis not present

## 2018-08-18 DIAGNOSIS — Z955 Presence of coronary angioplasty implant and graft: Secondary | ICD-10-CM | POA: Diagnosis not present

## 2018-08-18 DIAGNOSIS — H47611 Cortical blindness, right side of brain: Secondary | ICD-10-CM | POA: Diagnosis not present

## 2018-08-18 DIAGNOSIS — H9190 Unspecified hearing loss, unspecified ear: Secondary | ICD-10-CM | POA: Diagnosis not present

## 2018-08-18 DIAGNOSIS — I69318 Other symptoms and signs involving cognitive functions following cerebral infarction: Secondary | ICD-10-CM | POA: Diagnosis not present

## 2018-08-18 DIAGNOSIS — I25118 Atherosclerotic heart disease of native coronary artery with other forms of angina pectoris: Secondary | ICD-10-CM | POA: Diagnosis not present

## 2018-08-18 DIAGNOSIS — I6522 Occlusion and stenosis of left carotid artery: Secondary | ICD-10-CM | POA: Diagnosis not present

## 2018-08-18 DIAGNOSIS — Z9181 History of falling: Secondary | ICD-10-CM | POA: Diagnosis not present

## 2018-08-18 DIAGNOSIS — E785 Hyperlipidemia, unspecified: Secondary | ICD-10-CM | POA: Diagnosis not present

## 2018-08-18 DIAGNOSIS — I10 Essential (primary) hypertension: Secondary | ICD-10-CM | POA: Diagnosis not present

## 2018-08-22 ENCOUNTER — Other Ambulatory Visit: Payer: Self-pay | Admitting: Family Medicine

## 2018-08-22 DIAGNOSIS — F3341 Major depressive disorder, recurrent, in partial remission: Secondary | ICD-10-CM

## 2018-08-22 DIAGNOSIS — N400 Enlarged prostate without lower urinary tract symptoms: Secondary | ICD-10-CM

## 2018-08-22 DIAGNOSIS — I679 Cerebrovascular disease, unspecified: Secondary | ICD-10-CM

## 2018-08-22 DIAGNOSIS — I10 Essential (primary) hypertension: Secondary | ICD-10-CM

## 2018-08-22 DIAGNOSIS — E782 Mixed hyperlipidemia: Secondary | ICD-10-CM

## 2018-08-22 MED ORDER — METOPROLOL TARTRATE 25 MG PO TABS: 25.0000 mg | ORAL_TABLET | Freq: Two times a day (BID) | ORAL | 1 refills | Status: DC

## 2018-08-22 MED ORDER — LISINOPRIL 10 MG PO TABS: 10.0000 mg | ORAL_TABLET | Freq: Every day | ORAL | 1 refills | Status: DC

## 2018-08-22 MED ORDER — SERTRALINE HCL 25 MG PO TABS: 25.0000 mg | ORAL_TABLET | Freq: Every day | ORAL | 1 refills | Status: DC

## 2018-08-22 MED ORDER — FINASTERIDE 5 MG PO TABS: 5.0000 mg | ORAL_TABLET | Freq: Every day | ORAL | 1 refills | Status: DC

## 2018-08-22 MED ORDER — ATORVASTATIN CALCIUM 40 MG PO TABS: 40.0000 mg | ORAL_TABLET | Freq: Every day | ORAL | 1 refills | Status: DC

## 2018-08-23 ENCOUNTER — Other Ambulatory Visit: Payer: Self-pay

## 2018-08-23 DIAGNOSIS — I10 Essential (primary) hypertension: Secondary | ICD-10-CM | POA: Diagnosis not present

## 2018-08-23 DIAGNOSIS — Z7902 Long term (current) use of antithrombotics/antiplatelets: Secondary | ICD-10-CM | POA: Diagnosis not present

## 2018-08-23 DIAGNOSIS — I69318 Other symptoms and signs involving cognitive functions following cerebral infarction: Secondary | ICD-10-CM | POA: Diagnosis not present

## 2018-08-23 DIAGNOSIS — E785 Hyperlipidemia, unspecified: Secondary | ICD-10-CM | POA: Diagnosis not present

## 2018-08-23 DIAGNOSIS — I69398 Other sequelae of cerebral infarction: Secondary | ICD-10-CM | POA: Diagnosis not present

## 2018-08-23 DIAGNOSIS — Z9181 History of falling: Secondary | ICD-10-CM | POA: Diagnosis not present

## 2018-08-23 DIAGNOSIS — H47611 Cortical blindness, right side of brain: Secondary | ICD-10-CM | POA: Diagnosis not present

## 2018-08-23 DIAGNOSIS — I6522 Occlusion and stenosis of left carotid artery: Secondary | ICD-10-CM | POA: Diagnosis not present

## 2018-08-23 DIAGNOSIS — I25118 Atherosclerotic heart disease of native coronary artery with other forms of angina pectoris: Secondary | ICD-10-CM | POA: Diagnosis not present

## 2018-08-23 DIAGNOSIS — H9190 Unspecified hearing loss, unspecified ear: Secondary | ICD-10-CM | POA: Diagnosis not present

## 2018-08-23 DIAGNOSIS — Z955 Presence of coronary angioplasty implant and graft: Secondary | ICD-10-CM | POA: Diagnosis not present

## 2018-08-23 MED ORDER — LISINOPRIL 40 MG PO TABS: 40.0000 mg | ORAL_TABLET | Freq: Every day | ORAL | 0 refills | Status: DC

## 2018-08-23 NOTE — Progress Notes (Unsigned)
[name] called stating that Lisinopril was changed to 40mg  in hospital. I looked up hosp note and it was increased from 10mg  to 40mg  daily. Sent in Lisinopril 40mg  daily [facility]

## 2018-08-25 DIAGNOSIS — Z7902 Long term (current) use of antithrombotics/antiplatelets: Secondary | ICD-10-CM | POA: Diagnosis not present

## 2018-08-25 DIAGNOSIS — I10 Essential (primary) hypertension: Secondary | ICD-10-CM | POA: Diagnosis not present

## 2018-08-25 DIAGNOSIS — H9190 Unspecified hearing loss, unspecified ear: Secondary | ICD-10-CM | POA: Diagnosis not present

## 2018-08-25 DIAGNOSIS — H47611 Cortical blindness, right side of brain: Secondary | ICD-10-CM | POA: Diagnosis not present

## 2018-08-25 DIAGNOSIS — I69398 Other sequelae of cerebral infarction: Secondary | ICD-10-CM | POA: Diagnosis not present

## 2018-08-25 DIAGNOSIS — I25118 Atherosclerotic heart disease of native coronary artery with other forms of angina pectoris: Secondary | ICD-10-CM | POA: Diagnosis not present

## 2018-08-25 DIAGNOSIS — E785 Hyperlipidemia, unspecified: Secondary | ICD-10-CM | POA: Diagnosis not present

## 2018-08-25 DIAGNOSIS — Z955 Presence of coronary angioplasty implant and graft: Secondary | ICD-10-CM | POA: Diagnosis not present

## 2018-08-25 DIAGNOSIS — I69318 Other symptoms and signs involving cognitive functions following cerebral infarction: Secondary | ICD-10-CM | POA: Diagnosis not present

## 2018-08-25 DIAGNOSIS — I6522 Occlusion and stenosis of left carotid artery: Secondary | ICD-10-CM | POA: Diagnosis not present

## 2018-08-25 DIAGNOSIS — Z9181 History of falling: Secondary | ICD-10-CM | POA: Diagnosis not present

## 2018-08-29 DIAGNOSIS — Z955 Presence of coronary angioplasty implant and graft: Secondary | ICD-10-CM | POA: Diagnosis not present

## 2018-08-29 DIAGNOSIS — H47611 Cortical blindness, right side of brain: Secondary | ICD-10-CM | POA: Diagnosis not present

## 2018-08-29 DIAGNOSIS — Z7902 Long term (current) use of antithrombotics/antiplatelets: Secondary | ICD-10-CM | POA: Diagnosis not present

## 2018-08-29 DIAGNOSIS — Z9181 History of falling: Secondary | ICD-10-CM | POA: Diagnosis not present

## 2018-08-29 DIAGNOSIS — I69398 Other sequelae of cerebral infarction: Secondary | ICD-10-CM | POA: Diagnosis not present

## 2018-08-29 DIAGNOSIS — H9190 Unspecified hearing loss, unspecified ear: Secondary | ICD-10-CM | POA: Diagnosis not present

## 2018-08-29 DIAGNOSIS — E785 Hyperlipidemia, unspecified: Secondary | ICD-10-CM | POA: Diagnosis not present

## 2018-08-29 DIAGNOSIS — I25118 Atherosclerotic heart disease of native coronary artery with other forms of angina pectoris: Secondary | ICD-10-CM | POA: Diagnosis not present

## 2018-08-29 DIAGNOSIS — I10 Essential (primary) hypertension: Secondary | ICD-10-CM | POA: Diagnosis not present

## 2018-08-29 DIAGNOSIS — I69318 Other symptoms and signs involving cognitive functions following cerebral infarction: Secondary | ICD-10-CM | POA: Diagnosis not present

## 2018-08-29 DIAGNOSIS — I6522 Occlusion and stenosis of left carotid artery: Secondary | ICD-10-CM | POA: Diagnosis not present

## 2018-09-02 DIAGNOSIS — H9190 Unspecified hearing loss, unspecified ear: Secondary | ICD-10-CM | POA: Diagnosis not present

## 2018-09-02 DIAGNOSIS — I10 Essential (primary) hypertension: Secondary | ICD-10-CM | POA: Diagnosis not present

## 2018-09-02 DIAGNOSIS — Z955 Presence of coronary angioplasty implant and graft: Secondary | ICD-10-CM | POA: Diagnosis not present

## 2018-09-02 DIAGNOSIS — Z9181 History of falling: Secondary | ICD-10-CM | POA: Diagnosis not present

## 2018-09-02 DIAGNOSIS — I69398 Other sequelae of cerebral infarction: Secondary | ICD-10-CM | POA: Diagnosis not present

## 2018-09-02 DIAGNOSIS — Z7902 Long term (current) use of antithrombotics/antiplatelets: Secondary | ICD-10-CM | POA: Diagnosis not present

## 2018-09-02 DIAGNOSIS — I25118 Atherosclerotic heart disease of native coronary artery with other forms of angina pectoris: Secondary | ICD-10-CM | POA: Diagnosis not present

## 2018-09-02 DIAGNOSIS — I69318 Other symptoms and signs involving cognitive functions following cerebral infarction: Secondary | ICD-10-CM | POA: Diagnosis not present

## 2018-09-02 DIAGNOSIS — E785 Hyperlipidemia, unspecified: Secondary | ICD-10-CM | POA: Diagnosis not present

## 2018-09-02 DIAGNOSIS — H47611 Cortical blindness, right side of brain: Secondary | ICD-10-CM | POA: Diagnosis not present

## 2018-09-02 DIAGNOSIS — I6522 Occlusion and stenosis of left carotid artery: Secondary | ICD-10-CM | POA: Diagnosis not present

## 2018-09-08 ENCOUNTER — Encounter: Payer: Self-pay | Admitting: Family Medicine

## 2018-09-08 ENCOUNTER — Other Ambulatory Visit: Payer: Self-pay

## 2018-09-08 ENCOUNTER — Ambulatory Visit (INDEPENDENT_AMBULATORY_CARE_PROVIDER_SITE_OTHER): Payer: Medicare Other | Admitting: Family Medicine

## 2018-09-08 VITALS — BP 140/70 | HR 72 | Ht 69.0 in | Wt 162.0 lb

## 2018-09-08 DIAGNOSIS — F3341 Major depressive disorder, recurrent, in partial remission: Secondary | ICD-10-CM | POA: Diagnosis not present

## 2018-09-08 DIAGNOSIS — Z23 Encounter for immunization: Secondary | ICD-10-CM

## 2018-09-08 MED ORDER — SERTRALINE HCL 50 MG PO TABS: 50.0000 mg | ORAL_TABLET | Freq: Every day | ORAL | 3 refills | Status: DC

## 2018-09-08 NOTE — Progress Notes (Signed)
Date:  09/08/2018   Name:  [name]   DOB:  February 18, 1930   MRN:  [id number]   Chief Complaint: Depression (still combative- would like an increase on sertraline.)  Depression         This is a chronic problem.  The current episode started more than 1 year ago.   The onset quality is gradual.   The problem occurs intermittently (panic for years).  The problem has been waxing and waning since onset.  Associated symptoms include no decreased concentration, no fatigue, no helplessness, no hopelessness, does not have insomnia, not irritable, no restlessness, no decreased interest, no appetite change, no body aches, no myalgias, no headaches, no indigestion, not sad and no suicidal ideas.     The symptoms are aggravated by nothing.  Past treatments include SSRIs - Selective serotonin reuptake inhibitors.  Compliance with treatment is good.  Previous treatment provided no relief relief.   Review of Systems  Constitutional: Negative for appetite change, chills, fatigue and fever.  HENT: Negative for drooling, ear discharge, ear pain and sore throat.   Respiratory: Negative for cough, shortness of breath and wheezing.   Cardiovascular: Negative for chest pain, palpitations and leg swelling.  Gastrointestinal: Negative for abdominal pain, blood in stool, constipation, diarrhea and nausea.  Endocrine: Negative for polydipsia.  Genitourinary: Negative for dysuria, frequency, hematuria and urgency.  Musculoskeletal: Negative for back pain, myalgias and neck pain.  Skin: Negative for rash.  Allergic/Immunologic: Negative for environmental allergies.  Neurological: Negative for dizziness and headaches.  Hematological: Does not bruise/bleed easily.  Psychiatric/Behavioral: Positive for depression. Negative for decreased concentration and suicidal ideas. The patient is not nervous/anxious and does not have insomnia.     Patient Active Problem List   Diagnosis Date Noted  . Altered  mental status, unspecified 12/26/2017  . Benign prostatic hyperplasia 07/19/2017  . Cerebral vascular disease 07/10/2015  . Vision loss 09/25/2014  . Depression 09/25/2014  . Coronary artery disease of native artery of native heart with stable angina pectoris ([place]) 09/25/2013  . Visit for pre-operative examination 09/23/2011  . Hypertension 09/23/2011  . Carotid stenosis 09/23/2011  . Hyperlipidemia 09/23/2011    No Known Allergies  Past Surgical History:  Procedure Laterality Date  . HERNIA REPAIR      Social History   Tobacco Use  . Smoking status: Former Smoker    Packs/day: 1.00    Years: 25.00    Pack years: 25.00    Types: Cigarettes    Last attempt to quit: 06/03/1923    Years since quitting: 95.3  . Smokeless tobacco: Never Used  Substance Use Topics  . Alcohol use: No  . Drug use: No     Medication list has been reviewed and updated.  Current Meds  Medication Sig  . acetaminophen (TYLENOL) 500 MG tablet Take 500 mg by mouth as needed.  Marland Kitchen atorvastatin (LIPITOR) 40 MG tablet Take 1 tablet (40 mg total) by mouth daily.  . clopidogrel (PLAVIX) 75 MG tablet Take 1 tablet by mouth once daily  . cyanocobalamin 1000 MCG tablet Take 1,000 mcg by mouth daily.  . finasteride (PROSCAR) 5 MG tablet Take 1 tablet (5 mg total) by mouth daily.  Marland Kitchen lisinopril (PRINIVIL,ZESTRIL) 40 MG tablet Take 1 tablet (40 mg total) by mouth daily.  . metoprolol tartrate (LOPRESSOR) 25 MG tablet Take 1 tablet (25 mg total) by mouth 2 (two) times daily.  . Multiple Vitamin (MULTIVITAMIN) tablet Take 1 tablet by mouth daily.  Marland Kitchen  sertraline (ZOLOFT) 25 MG tablet Take 1 tablet (25 mg total) by mouth daily.    PHQ 2/9 Scores 09/08/2018 01/24/2018 07/19/2017 11/26/2016  PHQ - 2 Score 0 0 0 0  PHQ- 9 Score 0 - 0 5  Exception Documentation Other- indicate reason in comment box - - -  Not completed pt having difficulties with questions due to disease, no depression while [name] is with him.  - - -     Physical Exam Vitals signs and nursing note reviewed.  Constitutional:      General: He is not irritable. HENT:     Head: Normocephalic.     Right Ear: Tympanic membrane, ear canal and external ear normal.     Left Ear: Tympanic membrane, ear canal and external ear normal.     Nose: Nose normal. No congestion or rhinorrhea.     Mouth/Throat:     Pharynx: No oropharyngeal exudate or posterior oropharyngeal erythema.  Eyes:     General: No scleral icterus.       Right eye: No discharge.        Left eye: No discharge.     Conjunctiva/sclera: Conjunctivae normal.     Pupils: Pupils are equal, round, and reactive to light.  Neck:     Musculoskeletal: Normal range of motion and neck supple.     Thyroid: No thyromegaly.     Vascular: No JVD.     Trachea: No tracheal deviation.  Cardiovascular:     Rate and Rhythm: Normal rate and regular rhythm.     Heart sounds: Normal heart sounds. No murmur. No friction rub. No gallop.   Pulmonary:     Effort: No respiratory distress.     Breath sounds: Normal breath sounds. No stridor. No wheezing, rhonchi or rales.  Chest:     Chest wall: No tenderness.  Abdominal:     General: Bowel sounds are normal.     Palpations: Abdomen is soft. There is no mass.     Tenderness: There is no abdominal tenderness. There is no guarding or rebound.  Musculoskeletal: Normal range of motion.        General: No tenderness.  Lymphadenopathy:     Cervical: No cervical adenopathy.  Skin:    General: Skin is warm.     Findings: No rash.  Neurological:     Mental Status: He is alert and oriented to person, place, and time.     Cranial Nerves: No cranial nerve deficit.     Deep Tendon Reflexes: Reflexes are normal and symmetric.     Wt Readings from Last 3 Encounters:  09/08/18 162 lb (73.5 kg)  07/29/18 159 lb (72.1 kg)  07/05/18 170 lb (77.1 kg)    BP 140/70   Pulse 72   Ht 5\' 9"  (1.753 m)   Wt 162 lb (73.5 kg)   BMI 23.92 kg/m   Assessment  and Plan: 1. Recurrent major depressive disorder, in partial remission ([place]) Patient has a history of recurrent major depressive disorder for which he is on sertraline 25 mg.  Patient does well with the other caretaker but is very possessive and needs to be insight of the second caretaker at all times.  We will trial a 50 mg dosing of sertraline at this time to see if this may help his anxiety.  2. Influenza vaccine needed [place] and administered. - Flu vaccine HIGH DOSE PF

## 2018-09-28 ENCOUNTER — Other Ambulatory Visit: Payer: Self-pay

## 2018-09-28 ENCOUNTER — Encounter: Payer: Self-pay | Admitting: Emergency Medicine

## 2018-09-28 ENCOUNTER — Emergency Department: Payer: Medicare Other

## 2018-09-28 ENCOUNTER — Emergency Department
Admission: EM | Admit: 2018-09-28 | Discharge: 2018-09-28 | Disposition: A | Discharge status: Home / Self Care | Payer: Medicare Other | Attending: Emergency Medicine | Admitting: Emergency Medicine

## 2018-09-28 DIAGNOSIS — Y999 Unspecified external cause status: Secondary | ICD-10-CM | POA: Diagnosis not present

## 2018-09-28 DIAGNOSIS — Y939 Activity, unspecified: Secondary | ICD-10-CM | POA: Insufficient documentation

## 2018-09-28 DIAGNOSIS — Z87891 Personal history of nicotine dependence: Secondary | ICD-10-CM | POA: Diagnosis not present

## 2018-09-28 DIAGNOSIS — S0003XA Contusion of scalp, initial encounter: Secondary | ICD-10-CM | POA: Diagnosis not present

## 2018-09-28 DIAGNOSIS — I10 Essential (primary) hypertension: Secondary | ICD-10-CM | POA: Insufficient documentation

## 2018-09-28 DIAGNOSIS — I2510 Atherosclerotic heart disease of native coronary artery without angina pectoris: Secondary | ICD-10-CM | POA: Insufficient documentation

## 2018-09-28 DIAGNOSIS — H547 Unspecified visual loss: Secondary | ICD-10-CM | POA: Diagnosis not present

## 2018-09-28 DIAGNOSIS — Z7902 Long term (current) use of antithrombotics/antiplatelets: Secondary | ICD-10-CM | POA: Diagnosis not present

## 2018-09-28 DIAGNOSIS — H9190 Unspecified hearing loss, unspecified ear: Secondary | ICD-10-CM | POA: Insufficient documentation

## 2018-09-28 DIAGNOSIS — S0101XA Laceration without foreign body of scalp, initial encounter: Secondary | ICD-10-CM | POA: Diagnosis not present

## 2018-09-28 DIAGNOSIS — Z79899 Other long term (current) drug therapy: Secondary | ICD-10-CM | POA: Insufficient documentation

## 2018-09-28 DIAGNOSIS — W19XXXA Unspecified fall, initial encounter: Secondary | ICD-10-CM

## 2018-09-28 DIAGNOSIS — Y92009 Unspecified place in unspecified non-institutional (private) residence as the place of occurrence of the external cause: Secondary | ICD-10-CM | POA: Insufficient documentation

## 2018-09-28 DIAGNOSIS — S0990XA Unspecified injury of head, initial encounter: Secondary | ICD-10-CM | POA: Diagnosis not present

## 2018-09-28 DIAGNOSIS — R52 Pain, unspecified: Secondary | ICD-10-CM | POA: Diagnosis not present

## 2018-09-28 DIAGNOSIS — R58 Hemorrhage, not elsewhere classified: Secondary | ICD-10-CM | POA: Diagnosis not present

## 2018-09-28 DIAGNOSIS — W01198A Fall on same level from slipping, tripping and stumbling with subsequent striking against other object, initial encounter: Secondary | ICD-10-CM | POA: Diagnosis not present

## 2018-09-28 LAB — GLUCOSE, CAPILLARY: Glucose-Capillary: 115 mg/dL — ABNORMAL HIGH (ref 70–99)

## 2018-09-28 NOTE — ED Notes (Signed)
Patient urinated in urinal and patient given warm blanket.
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
## 2018-09-28 NOTE — ED Notes (Signed)
Went over D/d info with patient in person and caretaker on phone

## 2018-09-28 NOTE — ED Triage Notes (Addendum)
Patient coming from home for unwitnessed fall where he hit the back of his head on concrete wall and has laceration to back of head. States he lost his footing and has no LOC. Patient takes plavix and has hx of prior stroke.

## 2018-11-04 ENCOUNTER — Other Ambulatory Visit: Payer: Self-pay | Admitting: Family Medicine

## 2018-11-04 ENCOUNTER — Other Ambulatory Visit: Payer: Self-pay

## 2018-11-04 DIAGNOSIS — I10 Essential (primary) hypertension: Secondary | ICD-10-CM

## 2018-11-04 NOTE — Patient Outreach (Signed)
[place] [facility]) Care Management  11/04/2018  [name] 01-Mar-1930 [id number]   Medication Adherence call to [name] spoke with patient's care giver she explain patient is still 1 tablet daily and fills a pill box every week patient ask if we can call [facility] an order this medication and for next time he will order it thru mail order. [facility] will have it ready for patient. [name] is showing past due under [place].   [facility] Management Direct Dial [phone]  Fax [phone] [email or web address]

## 2018-11-06 ENCOUNTER — Other Ambulatory Visit: Payer: Self-pay | Admitting: Family Medicine

## 2018-11-06 DIAGNOSIS — I679 Cerebrovascular disease, unspecified: Secondary | ICD-10-CM

## 2018-11-08 ENCOUNTER — Telehealth: Payer: Self-pay

## 2018-11-08 NOTE — Telephone Encounter (Signed)
Verbal consent given by caregiver, Lafe Garin (Listed on DPR)

## 2018-11-08 NOTE — Telephone Encounter (Signed)
Virtual Visit Pre-Appointment Phone Call  "Tarvares, I am calling you today to discuss your upcoming appointment. We are currently trying to limit exposure to the virus that causes COVID-19 by seeing patients at home rather than in the office."  1. "What is the BEST phone number to call the day of the visit?" - include this in appointment notes  2. Do you have or have access to (through a family member/friend) a smartphone with video capability that we can use for your visit?" a. If yes - list this number in appt notes as cell (if different from BEST phone #) and list the appointment type as a VIDEO visit in appointment notes b. If no - list the appointment type as a PHONE visit in appointment notes  3. Confirm consent - "In the setting of the current Covid19 crisis, you are scheduled for a phone visit with your provider on Nov 10, 2018 at 10:30AM.  Just as we do with many in-office visits, in order for you to participate in this visit, we must obtain consent.  If you'd like, I can send this to your mychart (if signed up) or email for you to review.  Otherwise, I can obtain your verbal consent now.  All virtual visits are billed to your insurance company just like a normal visit would be.  By agreeing to a virtual visit, we'd like you to understand that the technology does not allow for your provider to perform an examination, and thus may limit your provider's ability to fully assess your condition. If your provider identifies any concerns that need to be evaluated in person, we will make arrangements to do so.  Finally, though the technology is pretty good, we cannot assure that it will always work on either your or our end, and in the setting of a video visit, we may have to convert it to a phone-only visit.  In either situation, we cannot ensure that we have a secure connection.  Are you willing to proceed?" STAFF: Did the patient verbally acknowledge consent to telehealth visit? Document  YES/NO here: YES  4. Advise patient to be prepared - "Two hours prior to your appointment, go ahead and check your blood pressure, pulse, oxygen saturation, and your weight (if you have the equipment to check those) and write them all down. When your visit starts, your provider will ask you for this information. If you have an Apple Watch or Kardia device, please plan to have heart rate information ready on the day of your appointment. Please have a pen and paper handy nearby the day of the visit as well."  5. Give patient instructions for MyChart download to smartphone OR [email or web address] as below if video visit (depending on what platform provider is using)  6. Inform patient they will receive a phone call 15 minutes prior to their appointment time (may be from unknown caller ID) so they should be prepared to answer    TELEPHONE CALL NOTE  Ghazi Harsh Trulock has been deemed a candidate for a follow-up tele-health visit to limit community exposure during the Covid-19 pandemic. I spoke with the patient via phone to ensure availability of phone/video source, confirm preferred email & phone number, and discuss instructions and expectations.  I reminded Liban Guedes to be prepared with any vital sign and/or heart rhythm information that could potentially be obtained via home monitoring, at the time of his visit. I reminded Doss Cybulski to expect a phone call prior to  his visit.  Horton Finer 11/08/2018 3:46 PM    FULL LENGTH CONSENT FOR TELE-HEALTH VISIT   I hereby voluntarily request, consent and authorize [facility] and its employed or contracted physicians, physician assistants, nurse practitioners or other licensed health care professionals (the Practitioner), to provide me with telemedicine health care services (the Services") as deemed necessary by the treating Practitioner. I acknowledge and consent to receive the Services by the  Practitioner via telemedicine. I understand that the telemedicine visit will involve communicating with the Practitioner through live audiovisual communication technology and the disclosure of certain medical information by electronic transmission. I acknowledge that I have been given the opportunity to request an in-person assessment or other available alternative prior to the telemedicine visit and am voluntarily participating in the telemedicine visit.  I understand that I have the right to withhold or withdraw my consent to the use of telemedicine in the course of my care at any time, without affecting my right to future care or treatment, and that the Practitioner or I may terminate the telemedicine visit at any time. I understand that I have the right to inspect all information obtained and/or recorded in the course of the telemedicine visit and may receive copies of available information for a reasonable fee.  I understand that some of the potential risks of receiving the Services via telemedicine include:   Delay or interruption in medical evaluation due to technological equipment failure or disruption;  Information transmitted may not be sufficient (e.g. poor resolution of images) to allow for appropriate medical decision making by the Practitioner; and/or   In rare instances, security protocols could fail, causing a breach of personal health information.  Furthermore, I acknowledge that it is my responsibility to provide information about my medical history, conditions and care that is complete and accurate to the best of my ability. I acknowledge that Practitioner's advice, recommendations, and/or decision may be based on factors not within their control, such as incomplete or inaccurate data provided by me or distortions of diagnostic images or specimens that may result from electronic transmissions. I understand that the practice of medicine is not an exact science and that Practitioner makes  no warranties or guarantees regarding treatment outcomes. I acknowledge that I will receive a copy of this consent concurrently upon execution via email to the email address I last provided but may also request a printed copy by calling the office of [facility].    I understand that my insurance will be billed for this visit.   I have read or had this consent read to me.  I understand the contents of this consent, which adequately explains the benefits and risks of the Services being provided via telemedicine.   I have been provided ample opportunity to ask questions regarding this consent and the Services and have had my questions answered to my satisfaction.  I give my informed consent for the services to be provided through the use of telemedicine in my medical care  By participating in this telemedicine visit I agree to the above.

## 2018-11-09 ENCOUNTER — Encounter: Payer: Self-pay | Admitting: Nurse Practitioner
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
## 2018-11-10 ENCOUNTER — Other Ambulatory Visit: Payer: Self-pay

## 2018-11-10 ENCOUNTER — Telehealth (INDEPENDENT_AMBULATORY_CARE_PROVIDER_SITE_OTHER): Payer: Medicare Other | Admitting: Nurse Practitioner

## 2018-11-10 ENCOUNTER — Encounter: Payer: Self-pay | Admitting: Nurse Practitioner

## 2018-11-10 VITALS — BP 163/72 | HR 71 | Ht 69.0 in | Wt 165.0 lb

## 2018-11-10 DIAGNOSIS — I679 Cerebrovascular disease, unspecified: Secondary | ICD-10-CM

## 2018-11-10 DIAGNOSIS — I2510 Atherosclerotic heart disease of native coronary artery without angina pectoris: Secondary | ICD-10-CM | POA: Diagnosis not present

## 2018-11-10 DIAGNOSIS — I10 Essential (primary) hypertension: Secondary | ICD-10-CM

## 2018-11-10 DIAGNOSIS — E782 Mixed hyperlipidemia: Secondary | ICD-10-CM | POA: Diagnosis not present

## 2018-11-10 MED ORDER — CLOPIDOGREL BISULFATE 75 MG PO TABS: 75.0000 mg | ORAL_TABLET | Freq: Every day | ORAL | 3 refills | Status: DC

## 2018-11-10 MED ORDER — LISINOPRIL 20 MG PO TABS: 20.0000 mg | ORAL_TABLET | Freq: Every day | ORAL | 3 refills | Status: DC

## 2018-11-10 MED ORDER — METOPROLOL TARTRATE 25 MG PO TABS: 25.0000 mg | ORAL_TABLET | Freq: Two times a day (BID) | ORAL | 3 refills | Status: DC

## 2018-11-10 MED ORDER — ATORVASTATIN CALCIUM 40 MG PO TABS: 40.0000 mg | ORAL_TABLET | Freq: Every day | ORAL | 3 refills | Status: DC

## 2018-11-10 NOTE — Patient Instructions (Signed)
It was a pleasure to speak with you on the phone today! Thank you for allowing [place] to continue taking care of your [facility] needs during this time.   Feel free to call as needed for questions and concerns related to your cardiac needs.   Medication Instructions:  Your physician has recommended you make the following change in your medication:  1- INCREASE Lisinopril to 1 tablet (20 mg total) once daily.   If you need a refill on your cardiac medications before your next appointment, please call your pharmacy.   Lab work: Your physician recommends that you return for lab work in: 1-3 weeks (CMET-have blood work on the day you have ultrasound, can go before or after) at the medical mall.  No appt is needed. Hours are M-F 7AM- 6 PM.  If you have labs (blood work) drawn today and your tests are completely normal, you will receive your results only by: Marland Kitchen MyChart Message (if you have MyChart) OR . A paper copy in the mail If you have any lab test that is abnormal or we need to change your treatment, we will call you to review the results.  Testing/Procedures: 1- Carotid Ultrasound Date:_______ Time:________  Your physician has requested that you have a carotid duplex. This test is an ultrasound of the carotid arteries in your neck. It looks at blood flow through these arteries that supply the brain with blood. Allow one hour for this exam. There are no restrictions or special instructions.    Follow-Up: At [facility], you and your health needs are our priority.  As part of our continuing mission to provide you with exceptional heart care, we have created designated Provider Care Teams.  These Care Teams include your primary Cardiologist (physician) and Advanced Practice Providers (APPs -  Physician Assistants and Nurse Practitioners) who all work together to provide you with the care you need, when you need it. You will need a follow up appointment in 6 months.  Please call our office 2  months in advance to schedule this appointment.  You may see Ida Rogue, MD or Murray Hodgkins, NP.

## 2018-11-11 ENCOUNTER — Telehealth: Payer: Self-pay

## 2018-11-11 NOTE — Telephone Encounter (Signed)
[name] called in to give B/P readings for Wed, Thursday, and Friday after decreasing pt's med in hospital from lisinopril 40 to 10mg . His readings were as follows: Wed- 161/82 P- 74, Thursday- 163/79 P- 72, and Friday- 140/86 P- 79. [name] also stated that pt's cardiologist (NP Murray Hodgkins) has decided to keep him on lisinopril 20mg / a halfway point to 40mg . I can see where the 20s were sent in and told [name] to follow up with cardio if there are any further issues. She voiced understanding

## 2018-11-21 ENCOUNTER — Ambulatory Visit (INDEPENDENT_AMBULATORY_CARE_PROVIDER_SITE_OTHER): Payer: Medicare Other

## 2018-11-21 ENCOUNTER — Other Ambulatory Visit: Payer: Self-pay

## 2018-11-21 DIAGNOSIS — I2510 Atherosclerotic heart disease of native coronary artery without angina pectoris: Secondary | ICD-10-CM | POA: Diagnosis not present

## 2018-11-22 ENCOUNTER — Telehealth: Payer: Self-pay | Admitting: *Deleted

## 2018-11-22 DIAGNOSIS — I6523 Occlusion and stenosis of bilateral carotid arteries: Secondary | ICD-10-CM

## 2018-11-22 NOTE — Telephone Encounter (Signed)
Spoke with [name], caregiver ok per DPR. She verbalized understanding of results. Repeat in 1 year carotid doppler entered.

## 2018-11-22 NOTE — Telephone Encounter (Signed)
-----   Message from Theora Gianotti, NP sent at 11/21/2018  4:05 PM EDT ----- Stable bilat carotid dzs - 1-39%.  F/u in 1 yr.

## 2018-11-26 ENCOUNTER — Other Ambulatory Visit: Payer: Self-pay | Admitting: Family Medicine

## 2018-11-26 DIAGNOSIS — I10 Essential (primary) hypertension: Secondary | ICD-10-CM

## 2019-01-12 ENCOUNTER — Telehealth: Payer: Self-pay

## 2019-01-12 DIAGNOSIS — Z7902 Long term (current) use of antithrombotics/antiplatelets: Secondary | ICD-10-CM | POA: Diagnosis not present

## 2019-01-12 DIAGNOSIS — R918 Other nonspecific abnormal finding of lung field: Secondary | ICD-10-CM | POA: Diagnosis not present

## 2019-01-12 DIAGNOSIS — I10 Essential (primary) hypertension: Secondary | ICD-10-CM | POA: Diagnosis not present

## 2019-01-12 DIAGNOSIS — Z8673 Personal history of transient ischemic attack (TIA), and cerebral infarction without residual deficits: Secondary | ICD-10-CM | POA: Diagnosis not present

## 2019-01-12 DIAGNOSIS — H9190 Unspecified hearing loss, unspecified ear: Secondary | ICD-10-CM | POA: Diagnosis not present

## 2019-01-12 DIAGNOSIS — J189 Pneumonia, unspecified organism: Secondary | ICD-10-CM | POA: Diagnosis not present

## 2019-01-12 DIAGNOSIS — I2510 Atherosclerotic heart disease of native coronary artery without angina pectoris: Secondary | ICD-10-CM | POA: Diagnosis not present

## 2019-01-12 DIAGNOSIS — Z79899 Other long term (current) drug therapy: Secondary | ICD-10-CM | POA: Diagnosis not present

## 2019-01-12 DIAGNOSIS — R410 Disorientation, unspecified: Secondary | ICD-10-CM | POA: Diagnosis not present

## 2019-01-12 DIAGNOSIS — R2689 Other abnormalities of gait and mobility: Secondary | ICD-10-CM | POA: Diagnosis not present

## 2019-01-12 DIAGNOSIS — Z9181 History of falling: Secondary | ICD-10-CM | POA: Diagnosis not present

## 2019-01-12 DIAGNOSIS — E785 Hyperlipidemia, unspecified: Secondary | ICD-10-CM | POA: Diagnosis not present

## 2019-01-12 NOTE — Telephone Encounter (Signed)
[name] called stating pt was hallucinating and family is suggesting to "give more medicine"- I spoke to [place] and we have suggested that he go to ER if not better. As he could be dehydrated or this could be a result of his dementia getting worse or possibly a UTI that could turn into sepsis. She voiced understanding and will speak to the family concerning his care.

## 2019-01-13 DIAGNOSIS — J189 Pneumonia, unspecified organism: Secondary | ICD-10-CM | POA: Diagnosis not present

## 2019-01-13 DIAGNOSIS — R918 Other nonspecific abnormal finding of lung field: Secondary | ICD-10-CM | POA: Diagnosis not present

## 2019-01-13 MED ORDER — CVS EAR DROPS OT
40.00 | OTIC | Status: DC
Start: 2019-01-14 — End: 2019-01-13

## 2019-01-13 MED ORDER — ATORVASTATIN CALCIUM 20 MG PO TABS
40.00 | ORAL_TABLET | ORAL | Status: DC
Start: ? — End: 2019-01-13

## 2019-01-13 MED ORDER — QUINERVA 260 MG PO TABS
650.00 | ORAL_TABLET | ORAL | Status: DC
Start: ? — End: 2019-01-13

## 2019-01-13 MED ORDER — Medication
1.00 | Status: DC
Start: 2019-01-14 — End: 2019-01-13

## 2019-01-13 MED ORDER — OLANZAPINE-FLUOXETINE HCL 6-50 MG PO CAPS
6.00 | ORAL_CAPSULE | ORAL | Status: DC
Start: ? — End: 2019-01-13

## 2019-01-13 MED ORDER — DIOTAME PO
25.00 | ORAL | Status: DC
Start: 2019-01-13 — End: 2019-01-13

## 2019-01-13 MED ORDER — SUBDUE PLUS PO LIQD
75.00 | ORAL | Status: DC
Start: 2019-01-14 — End: 2019-01-13

## 2019-01-13 MED ORDER — Medication
1.00 | Status: DC
Start: ? — End: 2019-01-13

## 2019-01-13 MED ORDER — DOLAGESIC PO
5.00 | ORAL | Status: DC
Start: 2019-01-14 — End: 2019-01-13

## 2019-01-13 MED ORDER — PYRILAMINE TAN-PHENYLEPH TAN 30-5 MG/5ML PO SUSP
50.00 | ORAL | Status: DC
Start: ? — End: 2019-01-13

## 2019-01-13 MED ORDER — PSEUDOEPH-CPM-DM-APAP 15-1-5-160 MG/5ML PO SYRP
25.00 | ORAL_SOLUTION | ORAL | Status: DC
Start: 2019-01-14 — End: 2019-01-13

## 2019-01-13 MED ORDER — FOSTEX MEDICATED EX
500.00 | CUTANEOUS | Status: DC
Start: 2019-01-14 — End: 2019-01-13

## 2019-02-03 ENCOUNTER — Ambulatory Visit (INDEPENDENT_AMBULATORY_CARE_PROVIDER_SITE_OTHER): Payer: Medicare Other | Admitting: Family Medicine

## 2019-02-03 ENCOUNTER — Encounter: Payer: Self-pay | Admitting: Family Medicine

## 2019-02-03 ENCOUNTER — Ambulatory Visit
Admission: RE | Admit: 2019-02-03 | Discharge: 2019-02-03 | Disposition: A | Discharge status: Home / Self Care | Payer: Medicare Other | Attending: Family Medicine | Admitting: Family Medicine

## 2019-02-03 ENCOUNTER — Other Ambulatory Visit: Payer: Self-pay

## 2019-02-03 ENCOUNTER — Ambulatory Visit
Admission: RE | Admit: 2019-02-03 | Discharge: 2019-02-03 | Disposition: A | Discharge status: Home / Self Care | Payer: Medicare Other | Source: Ambulatory Visit | Attending: Family Medicine | Admitting: Family Medicine

## 2019-02-03 VITALS — BP 140/80 | HR 60 | Ht 69.0 in | Wt 160.0 lb

## 2019-02-03 DIAGNOSIS — J189 Pneumonia, unspecified organism: Secondary | ICD-10-CM

## 2019-02-03 DIAGNOSIS — J984 Other disorders of lung: Secondary | ICD-10-CM | POA: Diagnosis not present

## 2019-02-03 DIAGNOSIS — F3341 Major depressive disorder, recurrent, in partial remission: Secondary | ICD-10-CM | POA: Diagnosis not present

## 2019-02-03 DIAGNOSIS — R918 Other nonspecific abnormal finding of lung field: Secondary | ICD-10-CM | POA: Diagnosis not present

## 2019-02-03 DIAGNOSIS — R351 Nocturia: Secondary | ICD-10-CM | POA: Diagnosis not present

## 2019-02-03 DIAGNOSIS — Z09 Encounter for follow-up examination after completed treatment for conditions other than malignant neoplasm: Secondary | ICD-10-CM

## 2019-02-03 DIAGNOSIS — J449 Chronic obstructive pulmonary disease, unspecified: Secondary | ICD-10-CM | POA: Diagnosis not present

## 2019-02-03 DIAGNOSIS — N401 Enlarged prostate with lower urinary tract symptoms: Secondary | ICD-10-CM

## 2019-02-03 MED ORDER — FINASTERIDE 5 MG PO TABS: 5.0000 mg | ORAL_TABLET | Freq: Every day | ORAL | 1 refills | Status: DC

## 2019-02-03 MED ORDER — SERTRALINE HCL 50 MG PO TABS: 50.0000 mg | ORAL_TABLET | Freq: Every day | ORAL | 1 refills | Status: DC

## 2019-02-03 NOTE — Progress Notes (Signed)
Date:  02/03/2019   Name:  [name]   DOB:  1930-06-14   MRN:  [id number]   Chief Complaint: Follow-up (hospital visit pneumonia), Depression, and Benign Prostatic Hypertrophy  Pt was recently admitted to [facility] on 7/23 for bilateral CAP and was discharged on 7/24. Doing well. Took all medications.  Depression        This is a chronic problem.  The current episode started more than 1 year ago.   The problem occurs constantly.  The problem has been waxing and waning since onset.  Associated symptoms include decreased concentration, fatigue, helplessness, hopelessness, restlessness, decreased interest and sad.  Associated symptoms include does not have insomnia, not irritable, no appetite change, no myalgias, no headaches, no indigestion and no suicidal ideas.     The symptoms are aggravated by family issues.  Past treatments include SSRIs - Selective serotonin reuptake inhibitors.  Compliance with treatment is good.  Previous treatment provided moderate relief. Benign Prostatic Hypertrophy This is a chronic problem. The current episode started more than 1 year ago. The problem has been waxing and waning since onset. Irritative symptoms include frequency, nocturia and urgency. Obstructive symptoms do not include dribbling, incomplete emptying, an intermittent stream, a slower stream, straining or a weak stream. Pertinent negatives include no chills, dysuria, hematuria, hesitancy, nausea or vomiting.    Review of Systems  Constitutional: Positive for fatigue. Negative for appetite change, chills and fever.  HENT: Negative for drooling, ear discharge, ear pain and sore throat.   Respiratory: Negative for cough, choking, chest tightness, shortness of breath and wheezing.   Cardiovascular: Negative for chest pain, palpitations and leg swelling.  Gastrointestinal: Negative for abdominal distention, abdominal pain, anal bleeding, blood in stool, constipation, diarrhea, nausea,  rectal pain and vomiting.  Endocrine: Negative for polydipsia.  Genitourinary: Positive for frequency, nocturia and urgency. Negative for dysuria, hematuria, hesitancy and incomplete emptying.  Musculoskeletal: Negative for back pain, myalgias and neck pain.  Skin: Negative for rash.  Allergic/Immunologic: Negative for environmental allergies.  Neurological: Negative for dizziness and headaches.  Hematological: Does not bruise/bleed easily.  Psychiatric/Behavioral: Positive for decreased concentration and depression. Negative for suicidal ideas. The patient is not nervous/anxious and does not have insomnia.     Patient Active Problem List   Diagnosis Date Noted  . Altered mental status, unspecified 12/26/2017  . Benign prostatic hyperplasia 07/19/2017  . Cerebral vascular disease 07/10/2015  . Vision loss 09/25/2014  . Depression 09/25/2014  . Coronary artery disease of native artery of native heart with stable angina pectoris ([place]) 09/25/2013  . Visit for pre-operative examination 09/23/2011  . Hypertension 09/23/2011  . Carotid stenosis 09/23/2011  . Hyperlipidemia 09/23/2011    Allergies  Allergen Reactions  . Levofloxacin Other (See Comments)    Hyperactive delirium and hallucinations    Past Surgical History:  Procedure Laterality Date  . HERNIA REPAIR      Social History   Tobacco Use  . Smoking status: Former Smoker    Packs/day: 1.00    Years: 25.00    Pack years: 25.00    Types: Cigarettes    Quit date: 06/03/1923    Years since quitting: 95.7  . Smokeless tobacco: Never Used  Substance Use Topics  . Alcohol use: No  . Drug use: No     Medication list has been reviewed and updated.  Current Meds  Medication Sig  . acetaminophen (TYLENOL) 500 MG tablet Take 500 mg by mouth as needed.  Marland Kitchen atorvastatin (  LIPITOR) 40 MG tablet Take 1 tablet (40 mg total) by mouth daily.  . clopidogrel (PLAVIX) 75 MG tablet Take 1 tablet (75 mg total) by mouth daily.  .  cyanocobalamin 1000 MCG tablet Take 1,000 mcg by mouth daily.  . finasteride (PROSCAR) 5 MG tablet Take 1 tablet (5 mg total) by mouth daily.  Marland Kitchen lisinopril (ZESTRIL) 20 MG tablet Take 1 tablet (20 mg total) by mouth daily.  . metoprolol tartrate (LOPRESSOR) 25 MG tablet Take 1 tablet (25 mg total) by mouth 2 (two) times daily.  . Multiple Vitamin (MULTIVITAMIN) tablet Take 1 tablet by mouth daily.  . sertraline (ZOLOFT) 50 MG tablet Take 1 tablet (50 mg total) by mouth daily.    PHQ 2/9 Scores 09/08/2018 01/24/2018 07/19/2017 11/26/2016  PHQ - 2 Score 0 0 0 0  PHQ- 9 Score 0 - 0 5  Exception Documentation Other- indicate reason in comment box - - -  Not completed pt having difficulties with questions due to disease, no depression while [name] is with him.  - - -    BP Readings from Last 3 Encounters:  02/03/19 140/80  11/10/18 (!) 163/72  09/28/18 (!) 167/124    Physical Exam Vitals signs and nursing note reviewed.  Constitutional:      General: He is not irritable.    Appearance: Normal appearance. He is normal weight.  HENT:     Head: Normocephalic.     Right Ear: Tympanic membrane, ear canal and external ear normal.     Left Ear: Tympanic membrane, ear canal and external ear normal.     Nose: Nose normal. No congestion or rhinorrhea.     Mouth/Throat:     Mouth: Mucous membranes are moist.     Pharynx: Oropharynx is clear.  Eyes:     General: No scleral icterus.       Right eye: No discharge.        Left eye: No discharge.     Conjunctiva/sclera: Conjunctivae normal.     Pupils: Pupils are equal, round, and reactive to light.  Neck:     Musculoskeletal: Normal range of motion and neck supple.     Thyroid: No thyromegaly.     Vascular: No JVD.     Trachea: No tracheal deviation.  Cardiovascular:     Rate and Rhythm: Normal rate and regular rhythm.     Pulses: Normal pulses.     Heart sounds: Normal heart sounds. No murmur. No friction rub. No gallop.   Pulmonary:      Effort: No respiratory distress.     Breath sounds: Normal breath sounds. No decreased air movement. No decreased breath sounds, wheezing, rhonchi or rales.  Abdominal:     General: Abdomen is flat. Bowel sounds are normal.     Palpations: Abdomen is soft. There is no mass.     Tenderness: There is no abdominal tenderness. There is no guarding or rebound.  Musculoskeletal: Normal range of motion.        General: No tenderness.  Lymphadenopathy:     Cervical: No cervical adenopathy.  Skin:    General: Skin is warm.     Findings: No rash.  Neurological:     Mental Status: He is alert and oriented to person, place, and time.     Cranial Nerves: No cranial nerve deficit.     Deep Tendon Reflexes: Reflexes are normal and symmetric.     Wt Readings from Last 3 Encounters:  02/03/19 160 lb (  72.6 kg)  11/10/18 165 lb (74.8 kg)  09/28/18 161 lb 13.1 oz (73.4 kg)    BP 140/80   Pulse 60   Ht 5\' 9"  (1.753 m)   Wt 160 lb (72.6 kg)   BMI 23.63 kg/m   Assessment and Plan:  1. Benign prostatic hyperplasia Patient with history of benign prostatic hyperplasia.  Will continue finasteride 5 mg once a day. - finasteride (PROSCAR) 5 MG tablet; Take 1 tablet (5 mg total) by mouth daily.  Dispense: 90 tablet; Refill: 1  2. Recurrent major depressive disorder, in partial remission ([place]) Patient with history of depression however is controlled on sertraline 50 mg once a day. - sertraline (ZOLOFT) 50 MG tablet; Take 1 tablet (50 mg total) by mouth daily.  Dispense: 90 tablet; Refill: 1  3. Hospital discharge follow-up Follow-up for community-acquired pneumonia bilateral for which he was treated at [facility].  Patient has completed discharge medications and is doing well lungs are normal on auscultation today we will follow-up as needed.  4. Pneumonia of both lungs due to infectious organism, unspecified part of lung Patient was noted to have bilateral community-acquired pneumonia.  Patient has  been completed on outpatient medication and will check chest x-ray and further evaluation. - DG Chest 2 View; Future

## 2019-02-06 ENCOUNTER — Other Ambulatory Visit: Payer: Self-pay

## 2019-02-06 DIAGNOSIS — R911 Solitary pulmonary nodule: Secondary | ICD-10-CM

## 2019-02-06 NOTE — Progress Notes (Unsigned)
Ref chest CT

## 2019-02-13 ENCOUNTER — Encounter: Payer: Self-pay | Admitting: Nurse Practitioner

## 2019-02-13 ENCOUNTER — Other Ambulatory Visit: Payer: Self-pay

## 2019-02-13 ENCOUNTER — Telehealth: Payer: Self-pay

## 2019-02-13 ENCOUNTER — Ambulatory Visit
Admission: RE | Admit: 2019-02-13 | Discharge: 2019-02-13 | Disposition: A | Discharge status: Home / Self Care | Payer: Medicare Other | Source: Ambulatory Visit | Attending: Family Medicine | Admitting: Family Medicine

## 2019-02-13 DIAGNOSIS — I679 Cerebrovascular disease, unspecified: Secondary | ICD-10-CM

## 2019-02-13 DIAGNOSIS — I10 Essential (primary) hypertension: Secondary | ICD-10-CM

## 2019-02-13 DIAGNOSIS — R911 Solitary pulmonary nodule: Secondary | ICD-10-CM | POA: Diagnosis not present

## 2019-02-13 DIAGNOSIS — E782 Mixed hyperlipidemia: Secondary | ICD-10-CM

## 2019-02-13 DIAGNOSIS — R918 Other nonspecific abnormal finding of lung field: Secondary | ICD-10-CM | POA: Diagnosis not present

## 2019-02-13 MED ORDER — LISINOPRIL 20 MG PO TABS: 20.0000 mg | ORAL_TABLET | Freq: Every day | ORAL | 3 refills | Status: DC

## 2019-02-13 MED ORDER — METOPROLOL TARTRATE 25 MG PO TABS: 25.0000 mg | ORAL_TABLET | Freq: Two times a day (BID) | ORAL | 3 refills | Status: DC

## 2019-02-13 MED ORDER — CLOPIDOGREL BISULFATE 75 MG PO TABS: 75.0000 mg | ORAL_TABLET | Freq: Every day | ORAL | 3 refills | Status: DC

## 2019-02-13 MED ORDER — ATORVASTATIN CALCIUM 40 MG PO TABS: 40.0000 mg | ORAL_TABLET | Freq: Every day | ORAL | 3 refills | Status: DC

## 2019-02-13 MED ORDER — IOHEXOL 300 MG/ML  SOLN
75.0000 mL | Freq: Once | INTRAMUSCULAR | Status: AC | PRN
  Administered 2019-02-13: 75 mL via INTRAVENOUS

## 2019-02-13 NOTE — Patient Outreach (Signed)
[facility]) Care Management  02/13/2019  Crist Kruszka March 07, 1930 [id number]   Medication Adherence call to Mr. Hinda Glatter  Patient did not answer patient is showing past due on Lisinopril 10 mg under [place].   [facility] Management Direct Dial [phone]  Fax [phone] [email or web address]

## 2019-02-13 NOTE — Telephone Encounter (Signed)
Valarie Merino called to give report of CT chest on pt- see report in chart [phone]

## 2019-02-13 NOTE — Telephone Encounter (Signed)
PCP states patient rx should be sent to [facility] This encounter was created in error - please disregard.

## 2019-02-13 NOTE — Progress Notes (Unsigned)
Put in oncology referral

## 2019-02-13 NOTE — Telephone Encounter (Signed)
*  STAT* If patient is at the pharmacy, call can be transferred to refill team.   1. Which medications need to be refilled? (please list name of each medication and dose if known) Lipitor, Plavix, Lisinopril, Metoprolol  2. Which pharmacy/location (including street and city if local pharmacy) is medication to be sent to? [facility]  3. Do they need a 30 day or 90 day supply? [place]

## 2019-02-17 ENCOUNTER — Other Ambulatory Visit: Payer: Self-pay
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
## 2019-02-20 ENCOUNTER — Inpatient Hospital Stay: Payer: Medicare Other | Attending: Hematology and Oncology | Admitting: Hematology and Oncology

## 2019-02-20 ENCOUNTER — Encounter: Payer: Self-pay | Admitting: Hematology and Oncology

## 2019-02-20 ENCOUNTER — Other Ambulatory Visit: Payer: Self-pay

## 2019-02-20 VITALS — BP 158/82 | HR 67 | Temp 96.8°F | Resp 18 | Ht 69.0 in | Wt 159.0 lb

## 2019-02-20 DIAGNOSIS — Z79899 Other long term (current) drug therapy: Secondary | ICD-10-CM | POA: Diagnosis not present

## 2019-02-20 DIAGNOSIS — Z8673 Personal history of transient ischemic attack (TIA), and cerebral infarction without residual deficits: Secondary | ICD-10-CM | POA: Insufficient documentation

## 2019-02-20 DIAGNOSIS — I2510 Atherosclerotic heart disease of native coronary artery without angina pectoris: Secondary | ICD-10-CM | POA: Insufficient documentation

## 2019-02-20 DIAGNOSIS — I10 Essential (primary) hypertension: Secondary | ICD-10-CM | POA: Insufficient documentation

## 2019-02-20 DIAGNOSIS — Z87891 Personal history of nicotine dependence: Secondary | ICD-10-CM | POA: Diagnosis not present

## 2019-02-20 DIAGNOSIS — F0390 Unspecified dementia without behavioral disturbance: Secondary | ICD-10-CM | POA: Insufficient documentation

## 2019-02-20 DIAGNOSIS — R911 Solitary pulmonary nodule: Secondary | ICD-10-CM | POA: Diagnosis not present

## 2019-02-20 DIAGNOSIS — Z77090 Contact with and (suspected) exposure to asbestos: Secondary | ICD-10-CM | POA: Diagnosis not present

## 2019-02-23 ENCOUNTER — Other Ambulatory Visit: Payer: Medicare Other

## 2019-02-24 ENCOUNTER — Ambulatory Visit: Payer: Medicare Other | Admitting: Hematology and Oncology

## 2019-03-02 ENCOUNTER — Telehealth: Payer: Self-pay

## 2019-03-02 ENCOUNTER — Other Ambulatory Visit: Payer: Self-pay

## 2019-03-02 ENCOUNTER — Other Ambulatory Visit: Payer: Medicare Other

## 2019-03-02 ENCOUNTER — Ambulatory Visit
Admission: RE | Admit: 2019-03-02 | Discharge: 2019-03-02 | Disposition: A | Discharge status: Home / Self Care | Payer: Medicare Other | Source: Ambulatory Visit | Attending: Hematology and Oncology | Admitting: Hematology and Oncology

## 2019-03-02 DIAGNOSIS — I2510 Atherosclerotic heart disease of native coronary artery without angina pectoris: Secondary | ICD-10-CM | POA: Insufficient documentation

## 2019-03-02 DIAGNOSIS — R911 Solitary pulmonary nodule: Secondary | ICD-10-CM | POA: Diagnosis not present

## 2019-03-02 DIAGNOSIS — J984 Other disorders of lung: Secondary | ICD-10-CM | POA: Insufficient documentation

## 2019-03-02 DIAGNOSIS — K7689 Other specified diseases of liver: Secondary | ICD-10-CM | POA: Diagnosis not present

## 2019-03-02 DIAGNOSIS — J439 Emphysema, unspecified: Secondary | ICD-10-CM | POA: Insufficient documentation

## 2019-03-02 DIAGNOSIS — Z87891 Personal history of nicotine dependence: Secondary | ICD-10-CM | POA: Insufficient documentation

## 2019-03-02 LAB — GLUCOSE, CAPILLARY: Glucose-Capillary: 97 mg/dL (ref 70–99)

## 2019-03-02 MED ORDER — FLUDEOXYGLUCOSE F - 18 (FDG) INJECTION
8.7000 | Freq: Once | INTRAVENOUS | Status: AC | PRN
  Administered 2019-03-02: 09:00:00 8.73 via INTRAVENOUS
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
## 2019-03-02 NOTE — Progress Notes (Signed)
Tumor Board Documentation  [name] was presented by Dr Mike Gip at our Tumor Board on 03/02/2019, which included representatives from medical oncology, radiation oncology, pathology, radiology, surgical, surgical oncology, navigation, internal medicine, pharmacy, palliative care, research.  [name] currently presents as a new patient, for discussion with history of the following treatments: active survellience.  Additionally, we reviewed previous medical and familial history, history of present illness, and recent lab results along with all available histopathologic and imaging studies. The tumor board considered available treatment options and made the following recommendations: Biopsy, Radiation therapy (primary modality) Biopsy of LLL Nodule either CT or Bronchoscopic, SBRT  The following procedures/referrals were also placed: No orders of the defined types were placed in this encounter.   Clinical Trial Status: not discussed   Staging used: Not Applicable  National site-specific guidelines   were discussed with respect to the case.  Tumor board is a meeting of clinicians from various specialty areas who evaluate and discuss patients for whom a multidisciplinary approach is being considered. Final determinations in the plan of care are those of the provider(s). The responsibility for follow up of recommendations given during tumor board is that of the provider.   Today's extended care, comprehensive team conference, [place] was not present for the discussion and was not examined.   Multidisciplinary Tumor Board is a multidisciplinary case peer review process.  Decisions discussed in the Multidisciplinary Tumor Board reflect the opinions of the specialists present at the conference without having examined the patient.  Ultimately, treatment and diagnostic decisions rest with the primary provider(s) and the patient.
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
## 2019-03-03 ENCOUNTER — Encounter: Payer: Self-pay | Admitting: Hematology and Oncology

## 2019-03-03 ENCOUNTER — Inpatient Hospital Stay: Payer: Medicare Other | Attending: Hematology and Oncology | Admitting: Hematology and Oncology

## 2019-03-03 ENCOUNTER — Ambulatory Visit: Payer: Medicare Other | Admitting: Hematology and Oncology

## 2019-03-03 VITALS — BP 139/72 | HR 77 | Temp 96.6°F | Resp 18 | Ht 69.0 in | Wt 160.0 lb

## 2019-03-03 DIAGNOSIS — Z87891 Personal history of nicotine dependence: Secondary | ICD-10-CM | POA: Insufficient documentation

## 2019-03-03 DIAGNOSIS — Z8673 Personal history of transient ischemic attack (TIA), and cerebral infarction without residual deficits: Secondary | ICD-10-CM | POA: Insufficient documentation

## 2019-03-03 DIAGNOSIS — F0390 Unspecified dementia without behavioral disturbance: Secondary | ICD-10-CM | POA: Insufficient documentation

## 2019-03-03 DIAGNOSIS — R911 Solitary pulmonary nodule: Secondary | ICD-10-CM | POA: Diagnosis not present

## 2019-03-03 DIAGNOSIS — I2510 Atherosclerotic heart disease of native coronary artery without angina pectoris: Secondary | ICD-10-CM | POA: Insufficient documentation

## 2019-03-03 DIAGNOSIS — R918 Other nonspecific abnormal finding of lung field: Secondary | ICD-10-CM | POA: Insufficient documentation

## 2019-03-03 DIAGNOSIS — Z7189 Other specified counseling: Secondary | ICD-10-CM | POA: Diagnosis not present

## 2019-03-04 DIAGNOSIS — H9193 Unspecified hearing loss, bilateral: Secondary | ICD-10-CM | POA: Diagnosis not present

## 2019-03-04 DIAGNOSIS — C3432 Malignant neoplasm of lower lobe, left bronchus or lung: Secondary | ICD-10-CM | POA: Diagnosis not present

## 2019-03-04 DIAGNOSIS — R54 Age-related physical debility: Secondary | ICD-10-CM | POA: Diagnosis not present

## 2019-03-04 DIAGNOSIS — R402243 Coma scale, best verbal response, confused conversation, at hospital admission: Secondary | ICD-10-CM | POA: Diagnosis not present

## 2019-03-04 DIAGNOSIS — R402143 Coma scale, eyes open, spontaneous, at hospital admission: Secondary | ICD-10-CM | POA: Diagnosis not present

## 2019-03-04 DIAGNOSIS — I69998 Other sequelae following unspecified cerebrovascular disease: Secondary | ICD-10-CM | POA: Diagnosis not present

## 2019-03-04 DIAGNOSIS — R918 Other nonspecific abnormal finding of lung field: Secondary | ICD-10-CM | POA: Diagnosis not present

## 2019-03-04 DIAGNOSIS — G309 Alzheimer's disease, unspecified: Secondary | ICD-10-CM | POA: Diagnosis not present

## 2019-03-04 DIAGNOSIS — E785 Hyperlipidemia, unspecified: Secondary | ICD-10-CM | POA: Diagnosis not present

## 2019-03-04 DIAGNOSIS — R2681 Unsteadiness on feet: Secondary | ICD-10-CM | POA: Diagnosis not present

## 2019-03-04 DIAGNOSIS — I69354 Hemiplegia and hemiparesis following cerebral infarction affecting left non-dominant side: Secondary | ICD-10-CM | POA: Diagnosis not present

## 2019-03-04 DIAGNOSIS — I358 Other nonrheumatic aortic valve disorders: Secondary | ICD-10-CM | POA: Diagnosis not present

## 2019-03-04 DIAGNOSIS — I2510 Atherosclerotic heart disease of native coronary artery without angina pectoris: Secondary | ICD-10-CM | POA: Diagnosis not present

## 2019-03-04 DIAGNOSIS — I10 Essential (primary) hypertension: Secondary | ICD-10-CM | POA: Diagnosis not present

## 2019-03-04 DIAGNOSIS — R2232 Localized swelling, mass and lump, left upper limb: Secondary | ICD-10-CM | POA: Diagnosis not present

## 2019-03-04 DIAGNOSIS — R198 Other specified symptoms and signs involving the digestive system and abdomen: Secondary | ICD-10-CM | POA: Diagnosis not present

## 2019-03-04 DIAGNOSIS — R402363 Coma scale, best motor response, obeys commands, at hospital admission: Secondary | ICD-10-CM | POA: Diagnosis not present

## 2019-03-04 DIAGNOSIS — I6522 Occlusion and stenosis of left carotid artery: Secondary | ICD-10-CM | POA: Diagnosis not present

## 2019-03-04 DIAGNOSIS — I340 Nonrheumatic mitral (valve) insufficiency: Secondary | ICD-10-CM | POA: Diagnosis not present

## 2019-03-04 DIAGNOSIS — I6381 Other cerebral infarction due to occlusion or stenosis of small artery: Secondary | ICD-10-CM | POA: Diagnosis not present

## 2019-03-04 DIAGNOSIS — R338 Other retention of urine: Secondary | ICD-10-CM | POA: Diagnosis not present

## 2019-03-04 DIAGNOSIS — R260 Ataxic gait: Secondary | ICD-10-CM | POA: Diagnosis not present

## 2019-03-04 DIAGNOSIS — I69398 Other sequelae of cerebral infarction: Secondary | ICD-10-CM | POA: Diagnosis not present

## 2019-03-04 DIAGNOSIS — B9689 Other specified bacterial agents as the cause of diseases classified elsewhere: Secondary | ICD-10-CM | POA: Diagnosis not present

## 2019-03-04 DIAGNOSIS — N390 Urinary tract infection, site not specified: Secondary | ICD-10-CM | POA: Diagnosis not present

## 2019-03-04 DIAGNOSIS — I639 Cerebral infarction, unspecified: Secondary | ICD-10-CM | POA: Diagnosis not present

## 2019-03-04 DIAGNOSIS — R451 Restlessness and agitation: Secondary | ICD-10-CM | POA: Diagnosis not present

## 2019-03-04 DIAGNOSIS — M7989 Other specified soft tissue disorders: Secondary | ICD-10-CM | POA: Diagnosis not present

## 2019-03-04 DIAGNOSIS — G319 Degenerative disease of nervous system, unspecified: Secondary | ICD-10-CM | POA: Diagnosis not present

## 2019-03-04 DIAGNOSIS — M19032 Primary osteoarthritis, left wrist: Secondary | ICD-10-CM | POA: Diagnosis not present

## 2019-03-04 DIAGNOSIS — H538 Other visual disturbances: Secondary | ICD-10-CM | POA: Diagnosis not present

## 2019-03-10 DIAGNOSIS — Z7189 Other specified counseling: Secondary | ICD-10-CM | POA: Insufficient documentation

## 2019-03-14 MED ORDER — CLOPIDOGREL BISULFATE 75 MG PO TABS
75.00 | ORAL_TABLET | ORAL | Status: DC
Start: 2019-04-22 — End: 2019-03-14

## 2019-03-14 MED ORDER — RISPERIDONE 0.25 MG PO TABS
0.25 | ORAL_TABLET | ORAL | Status: DC
Start: ? — End: 2019-03-14

## 2019-03-14 MED ORDER — GENERIC EXTERNAL MEDICATION
2.00 | Status: DC
Start: 2019-04-21 — End: 2019-03-14

## 2019-03-14 MED ORDER — ASPIRIN 81 MG PO CHEW
81.00 | CHEWABLE_TABLET | ORAL | Status: DC
Start: 2019-03-21 — End: 2019-03-14

## 2019-03-14 MED ORDER — RISPERIDONE 1 MG PO TBDP
1.00 | ORAL_TABLET | ORAL | Status: DC
Start: ? — End: 2019-03-14

## 2019-03-14 MED ORDER — LISINOPRIL 20 MG PO TABS
20.00 | ORAL_TABLET | ORAL | Status: DC
Start: 2019-03-15 — End: 2019-03-14

## 2019-03-14 MED ORDER — FINASTERIDE 5 MG PO TABS
5.00 | ORAL_TABLET | ORAL | Status: DC
Start: 2019-04-22 — End: 2019-03-14

## 2019-03-14 MED ORDER — RISPERIDONE 1 MG PO TBDP
1.00 | ORAL_TABLET | ORAL | Status: DC
Start: 2019-03-14 — End: 2019-03-14

## 2019-03-14 MED ORDER — ACETAMINOPHEN 500 MG PO TABS
1000.00 | ORAL_TABLET | ORAL | Status: DC
Start: ? — End: 2019-03-14

## 2019-03-14 MED ORDER — ENOXAPARIN SODIUM 40 MG/0.4ML ~~LOC~~ SOLN
40.00 | SUBCUTANEOUS | Status: DC
Start: 2019-03-20 — End: 2019-03-14

## 2019-03-14 MED ORDER — GENERIC EXTERNAL MEDICATION
1.00 | Status: DC
Start: 2019-04-22 — End: 2019-03-14

## 2019-03-14 MED ORDER — RISPERIDONE 0.5 MG PO TABS
0.50 | ORAL_TABLET | ORAL | Status: DC
Start: 2019-03-15 — End: 2019-03-14

## 2019-03-14 MED ORDER — ATORVASTATIN CALCIUM 80 MG PO TABS
80.00 | ORAL_TABLET | ORAL | Status: DC
Start: 2019-04-21 — End: 2019-03-14

## 2019-03-14 MED ORDER — MELATONIN 3 MG PO TABS
6.00 | ORAL_TABLET | ORAL | Status: DC
Start: 2019-04-21 — End: 2019-03-14

## 2019-03-14 MED ORDER — SERTRALINE HCL 25 MG PO TABS
25.00 | ORAL_TABLET | ORAL | Status: DC
Start: 2019-04-22 — End: 2019-03-14

## 2019-03-14 MED ORDER — POLYETHYLENE GLYCOL 3350 17 G PO PACK
17.00 | PACK | ORAL | Status: DC
Start: 2019-04-21 — End: 2019-03-14

## 2019-03-14 MED ORDER — METOPROLOL TARTRATE 25 MG PO TABS
25.00 | ORAL_TABLET | ORAL | Status: DC
Start: 2019-03-20 — End: 2019-03-14

## 2019-03-15 ENCOUNTER — Institutional Professional Consult (permissible substitution): Payer: Medicare Other | Admitting: Pulmonary Disease

## 2019-03-20 MED ORDER — TAMSULOSIN HCL 0.4 MG PO CAPS
0.40 | ORAL_CAPSULE | ORAL | Status: DC
Start: 2019-03-20 — End: 2019-03-20

## 2019-03-20 MED ORDER — QUETIAPINE FUMARATE 25 MG PO TABS
12.50 | ORAL_TABLET | ORAL | Status: DC
Start: 2019-03-21 — End: 2019-03-20

## 2019-03-20 MED ORDER — QUETIAPINE FUMARATE 25 MG PO TABS
12.50 | ORAL_TABLET | ORAL | Status: DC
Start: ? — End: 2019-03-20

## 2019-03-20 MED ORDER — QUETIAPINE FUMARATE 25 MG PO TABS
25.00 | ORAL_TABLET | ORAL | Status: DC
Start: 2019-03-20 — End: 2019-03-20

## 2019-03-20 MED ORDER — GENERIC EXTERNAL MEDICATION
30.00 | Status: DC
Start: 2019-03-21 — End: 2019-03-20

## 2019-04-18 MED ORDER — TAMSULOSIN HCL 0.4 MG PO CAPS
0.80 | ORAL_CAPSULE | ORAL | Status: DC
Start: 2019-04-21 — End: 2019-04-18

## 2019-04-18 MED ORDER — TRAZODONE HCL 50 MG PO TABS
12.50 | ORAL_TABLET | ORAL | Status: DC
Start: 2019-04-21 — End: 2019-04-18

## 2019-04-18 MED ORDER — GENERIC EXTERNAL MEDICATION
Status: DC
Start: ? — End: 2019-04-18

## 2019-04-18 MED ORDER — QUETIAPINE FUMARATE 25 MG PO TABS
25.00 | ORAL_TABLET | ORAL | Status: DC
Start: 2019-04-21 — End: 2019-04-18

## 2019-04-18 MED ORDER — METOPROLOL SUCCINATE ER 25 MG PO TB24
50.00 | ORAL_TABLET | ORAL | Status: DC
Start: 2019-04-22 — End: 2019-04-18

## 2019-04-18 MED ORDER — TRAZODONE HCL 50 MG PO TABS
25.00 | ORAL_TABLET | ORAL | Status: DC
Start: ? — End: 2019-04-18

## 2019-04-18 MED ORDER — AMLODIPINE BESYLATE 10 MG PO TABS
10.00 | ORAL_TABLET | ORAL | Status: DC
Start: 2019-04-22 — End: 2019-04-18

## 2019-04-18 MED ORDER — INFLUENZA VAC SPLIT QUAD 0.5 ML IM SUSY
0.50 | PREFILLED_SYRINGE | INTRAMUSCULAR | Status: DC
Start: ? — End: 2019-04-18

## 2019-04-18 MED ORDER — LISINOPRIL 20 MG PO TABS
40.00 | ORAL_TABLET | ORAL | Status: DC
Start: 2019-04-22 — End: 2019-04-18

## 2019-04-25 DIAGNOSIS — I10 Essential (primary) hypertension: Secondary | ICD-10-CM | POA: Diagnosis not present

## 2019-04-25 DIAGNOSIS — H547 Unspecified visual loss: Secondary | ICD-10-CM | POA: Diagnosis not present

## 2019-04-25 DIAGNOSIS — I69398 Other sequelae of cerebral infarction: Secondary | ICD-10-CM | POA: Diagnosis not present

## 2019-04-25 DIAGNOSIS — I2510 Atherosclerotic heart disease of native coronary artery without angina pectoris: Secondary | ICD-10-CM | POA: Diagnosis not present

## 2019-04-26 DIAGNOSIS — I10 Essential (primary) hypertension: Secondary | ICD-10-CM | POA: Diagnosis not present

## 2019-04-26 DIAGNOSIS — I69398 Other sequelae of cerebral infarction: Secondary | ICD-10-CM | POA: Diagnosis not present

## 2019-04-26 DIAGNOSIS — I2510 Atherosclerotic heart disease of native coronary artery without angina pectoris: Secondary | ICD-10-CM | POA: Diagnosis not present

## 2019-04-26 DIAGNOSIS — H547 Unspecified visual loss: Secondary | ICD-10-CM | POA: Diagnosis not present

## 2019-04-27 ENCOUNTER — Telehealth: Payer: Self-pay

## 2019-04-27 NOTE — Telephone Encounter (Signed)
Called back to Sandy at [employer]. She was asking for twice week x 8 weeks and OT for R) hand swollen from a possible fall 1 week ago- advised to call Dr Quita Skye at [facility] The dr that originally put this order in. I gave her the telephone number and told her if there are any further issues to call us back. Number for [place]- [phone]

## 2019-04-28 ENCOUNTER — Telehealth: Payer: Self-pay

## 2019-04-28 NOTE — Telephone Encounter (Signed)
[name] called back stating that Dr Quita Skye has refused to sign for OT and PT, despite that she started this process. The "okay" was given for twice a week x 4 weeks for PT and OT for the hand has been approved. [phone]

## 2019-05-01 DIAGNOSIS — I10 Essential (primary) hypertension: Secondary | ICD-10-CM | POA: Diagnosis not present

## 2019-05-01 DIAGNOSIS — H547 Unspecified visual loss: Secondary | ICD-10-CM | POA: Diagnosis not present

## 2019-05-01 DIAGNOSIS — I2510 Atherosclerotic heart disease of native coronary artery without angina pectoris: Secondary | ICD-10-CM | POA: Diagnosis not present

## 2019-05-01 DIAGNOSIS — I69398 Other sequelae of cerebral infarction: Secondary | ICD-10-CM | POA: Diagnosis not present

## 2019-05-02 ENCOUNTER — Other Ambulatory Visit: Payer: Self-pay

## 2019-05-02 ENCOUNTER — Ambulatory Visit (INDEPENDENT_AMBULATORY_CARE_PROVIDER_SITE_OTHER): Payer: Medicare Other | Admitting: Family Medicine

## 2019-05-02 ENCOUNTER — Encounter: Payer: Self-pay | Admitting: Family Medicine

## 2019-05-02 VITALS — BP 132/68 | HR 89 | Ht 69.0 in | Wt 160.0 lb

## 2019-05-02 DIAGNOSIS — G309 Alzheimer's disease, unspecified: Secondary | ICD-10-CM

## 2019-05-02 DIAGNOSIS — F3341 Major depressive disorder, recurrent, in partial remission: Secondary | ICD-10-CM

## 2019-05-02 DIAGNOSIS — Z09 Encounter for follow-up examination after completed treatment for conditions other than malignant neoplasm: Secondary | ICD-10-CM

## 2019-05-02 DIAGNOSIS — N401 Enlarged prostate with lower urinary tract symptoms: Secondary | ICD-10-CM

## 2019-05-02 DIAGNOSIS — I2510 Atherosclerotic heart disease of native coronary artery without angina pectoris: Secondary | ICD-10-CM | POA: Diagnosis not present

## 2019-05-02 DIAGNOSIS — F0390 Unspecified dementia without behavioral disturbance: Secondary | ICD-10-CM | POA: Insufficient documentation

## 2019-05-02 DIAGNOSIS — F5104 Psychophysiologic insomnia: Secondary | ICD-10-CM

## 2019-05-02 DIAGNOSIS — I679 Cerebrovascular disease, unspecified: Secondary | ICD-10-CM | POA: Diagnosis not present

## 2019-05-02 DIAGNOSIS — F0281 Dementia in other diseases classified elsewhere with behavioral disturbance: Secondary | ICD-10-CM

## 2019-05-02 DIAGNOSIS — I10 Essential (primary) hypertension: Secondary | ICD-10-CM | POA: Diagnosis not present

## 2019-05-02 DIAGNOSIS — H547 Unspecified visual loss: Secondary | ICD-10-CM | POA: Diagnosis not present

## 2019-05-02 DIAGNOSIS — R911 Solitary pulmonary nodule: Secondary | ICD-10-CM

## 2019-05-02 DIAGNOSIS — E782 Mixed hyperlipidemia: Secondary | ICD-10-CM

## 2019-05-02 DIAGNOSIS — R351 Nocturia: Secondary | ICD-10-CM

## 2019-05-02 DIAGNOSIS — I69398 Other sequelae of cerebral infarction: Secondary | ICD-10-CM | POA: Diagnosis not present

## 2019-05-02 MED ORDER — FINASTERIDE 5 MG PO TABS: 5.0000 mg | ORAL_TABLET | Freq: Every day | ORAL | 1 refills | Status: AC

## 2019-05-02 MED ORDER — METOPROLOL SUCCINATE ER 50 MG PO TB24: 50.0000 mg | ORAL_TABLET | Freq: Every day | ORAL | 11 refills | Status: DC

## 2019-05-02 MED ORDER — ATORVASTATIN CALCIUM 40 MG PO TABS: 80.0000 mg | ORAL_TABLET | Freq: Every day | ORAL | 1 refills | Status: DC

## 2019-05-02 MED ORDER — LISINOPRIL 40 MG PO TABS: 40.0000 mg | ORAL_TABLET | Freq: Every day | ORAL | 1 refills | Status: DC

## 2019-05-02 MED ORDER — CLOPIDOGREL BISULFATE 75 MG PO TABS: 75.0000 mg | ORAL_TABLET | Freq: Every day | ORAL | 3 refills | Status: AC

## 2019-05-02 MED ORDER — SERTRALINE HCL 50 MG PO TABS: 25.0000 mg | ORAL_TABLET | Freq: Every day | ORAL | 1 refills | Status: DC

## 2019-05-02 MED ORDER — AMLODIPINE BESYLATE 10 MG PO TABS: 10.0000 mg | ORAL_TABLET | Freq: Every day | ORAL | 1 refills | Status: AC

## 2019-05-02 NOTE — Progress Notes (Signed)
Date:  05/02/2019   Name:  [name]   DOB:  1930/02/20   MRN:  [id number]   Chief Complaint: Follow-up (hospital follow up- stroke/ shirley with patient at visit)  Pt was recently admitted to [facility] on 10/22for thalamic cva and was discharged on 10/31. Transition of care call placed on 10/31.    Lab Results  Component Value Date   CREATININE 1.23 01/24/2018   BUN 18 01/24/2018   NA 139 01/24/2018   K 4.1 01/24/2018   CL 103 01/24/2018   CO2 22 01/24/2018   Lab Results  Component Value Date   CHOL 136 11/26/2016   HDL 29 (L) 11/26/2016   LDLCALC 60 11/26/2016   TRIG 237 (H) 11/26/2016   CHOLHDL 4.7 11/26/2016   Lab Results  Component Value Date   TSH 4.65 (H) 11/24/2013   No results found for: HGBA1C   Review of Systems  Constitutional: Negative for chills and fever.  HENT: Negative for drooling, ear discharge, ear pain and sore throat.   Respiratory: Negative for cough, shortness of breath and wheezing.   Cardiovascular: Negative for chest pain, palpitations and leg swelling.  Gastrointestinal: Negative for abdominal pain, blood in stool, constipation, diarrhea and nausea.  Endocrine: Negative for polydipsia.  Genitourinary: Negative for dysuria, frequency, hematuria and urgency.  Musculoskeletal: Negative for back pain, myalgias and neck pain.  Skin: Negative for rash.  Allergic/Immunologic: Negative for environmental allergies.  Neurological: Negative for dizziness and headaches.  Hematological: Does not bruise/bleed easily.  Psychiatric/Behavioral: Negative for suicidal ideas. The patient is not nervous/anxious.     Patient Active Problem List   Diagnosis Date Noted  . Dementia ([place]) 05/02/2019  . Goals of care, counseling/discussion 03/10/2019  . Left lower lobe pulmonary nodule 02/20/2019  . Right upper lobe pulmonary nodule 02/20/2019  . Altered mental status, unspecified 12/26/2017  . Benign prostatic hyperplasia 07/19/2017  .  Cerebral vascular disease 07/10/2015  . Vision loss 09/25/2014  . Depression 09/25/2014  . Coronary artery disease of native artery of native heart with stable angina pectoris ([place]) 09/25/2013  . Visit for pre-operative examination 09/23/2011  . Hypertension 09/23/2011  . Carotid stenosis 09/23/2011  . Hyperlipidemia 09/23/2011    Allergies  Allergen Reactions  . Levofloxacin Other (See Comments)    Hyperactive delirium and hallucinations    Past Surgical History:  Procedure Laterality Date  . HERNIA REPAIR      Social History   Tobacco Use  . Smoking status: Former Smoker    Packs/day: 1.00    Years: 25.00    Pack years: 25.00    Types: Cigarettes    Quit date: 06/03/1923    Years since quitting: 95.9  . Smokeless tobacco: Never Used  Substance Use Topics  . Alcohol use: No  . Drug use: No     Medication list has been reviewed and updated.  Current Meds  Medication Sig  . acetaminophen (TYLENOL) 500 MG tablet Take 500 mg by mouth as needed.  Marland Kitchen amLODipine (NORVASC) 10 MG tablet Take 10 mg by mouth daily.  Marland Kitchen atorvastatin (LIPITOR) 40 MG tablet Take 1 tablet (40 mg total) by mouth daily. (Patient taking differently: Take 80 mg by mouth daily. )  . clopidogrel (PLAVIX) 75 MG tablet Take 1 tablet (75 mg total) by mouth daily.  . finasteride (PROSCAR) 5 MG tablet Take 1 tablet (5 mg total) by mouth daily.  Marland Kitchen lisinopril (ZESTRIL) 40 MG tablet Take 1 tablet by mouth daily.  Marland Kitchen  metoprolol succinate (TOPROL-XL) 50 MG 24 hr tablet Take 1 tablet by mouth daily.  . Multiple Vitamins-Minerals (MULTIVITAMINS THER. W/MINERALS) TABS tablet Take 1 tablet by mouth daily.  . Polyethylene Glycol 3350 (PEG 3350) 17 GM/SCOOP POWD Take by mouth.  . QUEtiapine (SEROQUEL) 25 MG tablet Take 12.5 tablets by mouth 2 (two) times daily.   Marland Kitchen senna (SENOKOT) 8.6 MG TABS tablet Take 2 tablets by mouth.  . sertraline (ZOLOFT) 50 MG tablet Take 1 tablet (50 mg total) by mouth daily. (Patient taking  differently: Take 25 mg by mouth daily. )  . tamsulosin (FLOMAX) 0.4 MG CAPS capsule Take 0.8 mg by mouth at bedtime.  . traZODone (DESYREL) 50 MG tablet Take 25 mg by mouth at bedtime.    PHQ 2/9 Scores 09/08/2018 01/24/2018 07/19/2017 11/26/2016  PHQ - 2 Score 0 0 0 0  PHQ- 9 Score 0 - 0 5  Exception Documentation Other- indicate reason in comment box - - -  Not completed pt having difficulties with questions due to disease, no depression while [name] is with him.  - - -    BP Readings from Last 3 Encounters:  05/02/19 132/68  03/03/19 139/72  02/20/19 (!) 158/82    Physical Exam Vitals signs and nursing note reviewed.  HENT:     Head: Normocephalic.     Right Ear: Tympanic membrane, ear canal and external ear normal.     Left Ear: Tympanic membrane, ear canal and external ear normal.     Nose: Nose normal.     Mouth/Throat:     Mouth: Mucous membranes are moist.  Eyes:     General: No scleral icterus.       Right eye: No discharge.        Left eye: No discharge.     Conjunctiva/sclera: Conjunctivae normal.     Pupils: Pupils are equal, round, and reactive to light.  Neck:     Musculoskeletal: Normal range of motion and neck supple.     Thyroid: No thyromegaly.     Vascular: No JVD.     Trachea: No tracheal deviation.  Cardiovascular:     Rate and Rhythm: Normal rate and regular rhythm.     Pulses: Normal pulses.     Heart sounds: Normal heart sounds. No murmur. No friction rub. No gallop.   Pulmonary:     Effort: No respiratory distress.     Breath sounds: Normal breath sounds. No wheezing or rales.  Abdominal:     General: Bowel sounds are normal.     Palpations: Abdomen is soft. There is no mass.     Tenderness: There is no abdominal tenderness. There is no guarding or rebound.  Musculoskeletal: Normal range of motion.        General: No tenderness.  Lymphadenopathy:     Cervical: No cervical adenopathy.  Skin:    General: Skin is warm.     Findings: No rash.   Neurological:     General: No focal deficit present.     Mental Status: He is alert and oriented to person, place, and time.     Cranial Nerves: No cranial nerve deficit.     Deep Tendon Reflexes: Reflexes are normal and symmetric.     Wt Readings from Last 3 Encounters:  05/02/19 160 lb (72.6 kg)  03/03/19 160 lb (72.6 kg)  02/20/19 159 lb (72.1 kg)    BP 132/68   Pulse 89   Ht 5\' 9"  (1.753 m)  Wt 160 lb (72.6 kg)   SpO2 96%   BMI 23.63 kg/m  Patient has very, very complicated.  Review of discharge notes we have been able to clean most of the circumstances and assessment and plan as follows: Assessment and Plan: 1. Alzheimer's dementia with behavioral disturbance, unspecified timing of dementia onset [facility]) Patient has been diagnosed with dementia of an Alzheimer variety at [facility].  He is currently having some behavioral issues that are controlled on Seroquel 1/2 tablet to 1/4 tablet is what he is currently taking on a as needed basis. - senna (SENOKOT) 8.6 MG TABS tablet; Take 2 tablets by mouth. - QUEtiapine (SEROQUEL) 25 MG tablet; Take 12.5 tablets by mouth 2 (two) times daily.   2. Hospital discharge follow-up Patient's discharge from [facility] for admission for a thalamic stroke.  Patient is currently stable with current regimen.  3. Recurrent major depressive disorder, in partial remission (HCC) Chronic.  Controlled.  Continue sertraline 25 mg once a day. - sertraline (ZOLOFT) 50 MG tablet; Take 0.5 tablets (25 mg total) by mouth daily.  Dispense: 90 tablet; Refill: 1  4. Essential hypertension Chronic.  Controlled.  Continue amlodipine 10 mg once a day, metoprolol XL 50 mg once a day, and lisinopril 40 mg once a day. - amLODipine (NORVASC) 10 MG tablet; Take 1 tablet (10 mg total) by mouth daily.  Dispense: 90 tablet; Refill: 1 - metoprolol succinate (TOPROL-XL) 50 MG 24 hr tablet; Take 1 tablet (50 mg total) by mouth daily.  Dispense: 30 tablet; Refill: 11 - lisinopril  (ZESTRIL) 40 MG tablet; Take 1 tablet (40 mg total) by mouth daily.  Dispense: 30 tablet; Refill: 1  5. Cerebral vascular disease As mentioned above patient had a recent thalamic stroke which manifested as behavioral difficulties.  Patient will be continued on Plavix for antiplatelet therapy and further means of decreasing stroke will be to control cholesterol and hypertension. - clopidogrel (PLAVIX) 75 MG tablet; Take 1 tablet (75 mg total) by mouth daily.  Dispense: 90 tablet; Refill: 3  6. Left lower lobe pulmonary nodule Patient was noted to have a left lower lobe pulmonary nodule.  Patient is scheduled to see Dr. Patsey Berthold in November will follow [facility] pulmonary for further evaluation.  Dr. Mike Gip is also aware of this and this will be discussed at tumor board as to further evaluation in the future.  7. BPH associated with nocturia Chronic.  Controlled.  Continue Flomax 0.42 tablets at bedtime and Proscar for 5 mg once a day. - tamsulosin (FLOMAX) 0.4 MG CAPS capsule; Take 0.8 mg by mouth at bedtime. - finasteride (PROSCAR) 5 MG tablet; Take 1 tablet (5 mg total) by mouth daily.  Dispense: 90 tablet; Refill: 1  8. Mixed hyperlipidemia Chronic.  Controlled.  Continue atorvastatin 40 mg once a day and encourage a low-cholesterol diet. - atorvastatin (LIPITOR) 40 MG tablet; Take 2 tablets (80 mg total) by mouth daily.  Dispense: 90 tablet; Refill: 1  9. Psychophysiological insomnia Patient is unable to tolerate melatonin due to confusion.  I rather doubt this but patient does well on trazodone 50 mg which he is currently taking 25 mg at night for control. - traZODone (DESYREL) 50 MG tablet; Take 25 mg by mouth at bedtime.

## 2019-05-03 DIAGNOSIS — I10 Essential (primary) hypertension: Secondary | ICD-10-CM | POA: Diagnosis not present

## 2019-05-03 DIAGNOSIS — H547 Unspecified visual loss: Secondary | ICD-10-CM | POA: Diagnosis not present

## 2019-05-03 DIAGNOSIS — I2510 Atherosclerotic heart disease of native coronary artery without angina pectoris: Secondary | ICD-10-CM | POA: Diagnosis not present

## 2019-05-03 DIAGNOSIS — I69398 Other sequelae of cerebral infarction: Secondary | ICD-10-CM | POA: Diagnosis not present

## 2019-05-04 DIAGNOSIS — I69398 Other sequelae of cerebral infarction: Secondary | ICD-10-CM | POA: Diagnosis not present

## 2019-05-04 DIAGNOSIS — I2510 Atherosclerotic heart disease of native coronary artery without angina pectoris: Secondary | ICD-10-CM | POA: Diagnosis not present

## 2019-05-04 DIAGNOSIS — H547 Unspecified visual loss: Secondary | ICD-10-CM | POA: Diagnosis not present

## 2019-05-04 DIAGNOSIS — I10 Essential (primary) hypertension: Secondary | ICD-10-CM | POA: Diagnosis not present

## 2019-05-08 DIAGNOSIS — I10 Essential (primary) hypertension: Secondary | ICD-10-CM | POA: Diagnosis not present

## 2019-05-08 DIAGNOSIS — I69398 Other sequelae of cerebral infarction: Secondary | ICD-10-CM | POA: Diagnosis not present

## 2019-05-08 DIAGNOSIS — H547 Unspecified visual loss: Secondary | ICD-10-CM | POA: Diagnosis not present

## 2019-05-08 DIAGNOSIS — I2510 Atherosclerotic heart disease of native coronary artery without angina pectoris: Secondary | ICD-10-CM | POA: Diagnosis not present

## 2019-05-09 ENCOUNTER — Institutional Professional Consult (permissible substitution): Payer: Medicare Other | Admitting: Pulmonary Disease

## 2019-05-10 DIAGNOSIS — H547 Unspecified visual loss: Secondary | ICD-10-CM | POA: Diagnosis not present

## 2019-05-10 DIAGNOSIS — I10 Essential (primary) hypertension: Secondary | ICD-10-CM | POA: Diagnosis not present

## 2019-05-10 DIAGNOSIS — I2510 Atherosclerotic heart disease of native coronary artery without angina pectoris: Secondary | ICD-10-CM | POA: Diagnosis not present

## 2019-05-10 DIAGNOSIS — I69398 Other sequelae of cerebral infarction: Secondary | ICD-10-CM | POA: Diagnosis not present

## 2019-05-11 DIAGNOSIS — I10 Essential (primary) hypertension: Secondary | ICD-10-CM | POA: Diagnosis not present

## 2019-05-11 DIAGNOSIS — I69398 Other sequelae of cerebral infarction: Secondary | ICD-10-CM | POA: Diagnosis not present

## 2019-05-11 DIAGNOSIS — I2510 Atherosclerotic heart disease of native coronary artery without angina pectoris: Secondary | ICD-10-CM | POA: Diagnosis not present

## 2019-05-11 DIAGNOSIS — H547 Unspecified visual loss: Secondary | ICD-10-CM | POA: Diagnosis not present

## 2019-05-15 DIAGNOSIS — I2510 Atherosclerotic heart disease of native coronary artery without angina pectoris: Secondary | ICD-10-CM | POA: Diagnosis not present

## 2019-05-15 DIAGNOSIS — I10 Essential (primary) hypertension: Secondary | ICD-10-CM | POA: Diagnosis not present

## 2019-05-15 DIAGNOSIS — H547 Unspecified visual loss: Secondary | ICD-10-CM | POA: Diagnosis not present

## 2019-05-15 DIAGNOSIS — I69398 Other sequelae of cerebral infarction: Secondary | ICD-10-CM | POA: Diagnosis not present

## 2019-05-21 DIAGNOSIS — R2689 Other abnormalities of gait and mobility: Secondary | ICD-10-CM | POA: Diagnosis not present

## 2019-05-21 DIAGNOSIS — Z8673 Personal history of transient ischemic attack (TIA), and cerebral infarction without residual deficits: Secondary | ICD-10-CM | POA: Diagnosis not present

## 2019-05-22 DIAGNOSIS — R6889 Other general symptoms and signs: Secondary | ICD-10-CM | POA: Diagnosis not present

## 2019-05-22 DIAGNOSIS — D649 Anemia, unspecified: Secondary | ICD-10-CM | POA: Diagnosis not present

## 2019-05-23 DIAGNOSIS — N390 Urinary tract infection, site not specified: Secondary | ICD-10-CM | POA: Diagnosis not present

## 2019-05-23 DIAGNOSIS — R319 Hematuria, unspecified: Secondary | ICD-10-CM | POA: Diagnosis not present

## 2019-05-27 DIAGNOSIS — R319 Hematuria, unspecified: Secondary | ICD-10-CM | POA: Diagnosis not present

## 2019-05-27 DIAGNOSIS — Z79899 Other long term (current) drug therapy: Secondary | ICD-10-CM | POA: Diagnosis not present

## 2019-05-27 DIAGNOSIS — N390 Urinary tract infection, site not specified: Secondary | ICD-10-CM | POA: Diagnosis not present

## 2019-05-30 DIAGNOSIS — R2689 Other abnormalities of gait and mobility: Secondary | ICD-10-CM | POA: Diagnosis not present

## 2019-05-30 DIAGNOSIS — I639 Cerebral infarction, unspecified: Secondary | ICD-10-CM | POA: Diagnosis not present

## 2019-05-30 DIAGNOSIS — N390 Urinary tract infection, site not specified: Secondary | ICD-10-CM | POA: Diagnosis not present

## 2019-05-30 DIAGNOSIS — I10 Essential (primary) hypertension: Secondary | ICD-10-CM | POA: Diagnosis not present

## 2019-05-30 DIAGNOSIS — Z8673 Personal history of transient ischemic attack (TIA), and cerebral infarction without residual deficits: Secondary | ICD-10-CM | POA: Diagnosis not present

## 2019-05-30 DIAGNOSIS — G4700 Insomnia, unspecified: Secondary | ICD-10-CM | POA: Diagnosis not present

## 2019-05-30 DIAGNOSIS — R451 Restlessness and agitation: Secondary | ICD-10-CM | POA: Diagnosis not present

## 2019-05-31 DIAGNOSIS — D649 Anemia, unspecified: Secondary | ICD-10-CM | POA: Diagnosis not present

## 2019-05-31 DIAGNOSIS — I10 Essential (primary) hypertension: Secondary | ICD-10-CM | POA: Diagnosis not present

## 2019-06-06 ENCOUNTER — Inpatient Hospital Stay: Payer: Medicare Other | Admitting: Family Medicine

## 2019-06-06 DIAGNOSIS — I639 Cerebral infarction, unspecified: Secondary | ICD-10-CM | POA: Diagnosis not present

## 2019-06-06 DIAGNOSIS — R451 Restlessness and agitation: Secondary | ICD-10-CM | POA: Diagnosis not present

## 2019-06-06 DIAGNOSIS — G4700 Insomnia, unspecified: Secondary | ICD-10-CM | POA: Diagnosis not present

## 2019-06-15 DIAGNOSIS — Z8673 Personal history of transient ischemic attack (TIA), and cerebral infarction without residual deficits: Secondary | ICD-10-CM | POA: Diagnosis not present

## 2019-06-15 DIAGNOSIS — Z7902 Long term (current) use of antithrombotics/antiplatelets: Secondary | ICD-10-CM | POA: Diagnosis not present

## 2019-06-15 DIAGNOSIS — R2689 Other abnormalities of gait and mobility: Secondary | ICD-10-CM | POA: Diagnosis not present

## 2019-06-20 DIAGNOSIS — I639 Cerebral infarction, unspecified: Secondary | ICD-10-CM | POA: Diagnosis not present

## 2019-06-20 DIAGNOSIS — G4700 Insomnia, unspecified: Secondary | ICD-10-CM | POA: Diagnosis not present

## 2019-06-20 DIAGNOSIS — R451 Restlessness and agitation: Secondary | ICD-10-CM | POA: Diagnosis not present

## 2019-06-28 DIAGNOSIS — E119 Type 2 diabetes mellitus without complications: Secondary | ICD-10-CM | POA: Diagnosis not present

## 2019-06-28 DIAGNOSIS — E785 Hyperlipidemia, unspecified: Secondary | ICD-10-CM | POA: Diagnosis not present

## 2019-06-28 DIAGNOSIS — R569 Unspecified convulsions: Secondary | ICD-10-CM | POA: Diagnosis not present

## 2019-06-28 DIAGNOSIS — I10 Essential (primary) hypertension: Secondary | ICD-10-CM | POA: Diagnosis not present

## 2019-06-28 DIAGNOSIS — R6889 Other general symptoms and signs: Secondary | ICD-10-CM | POA: Diagnosis not present

## 2019-06-28 DIAGNOSIS — I82412 Acute embolism and thrombosis of left femoral vein: Secondary | ICD-10-CM | POA: Diagnosis not present

## 2019-07-16 DIAGNOSIS — I10 Essential (primary) hypertension: Secondary | ICD-10-CM | POA: Diagnosis not present

## 2019-07-16 DIAGNOSIS — E785 Hyperlipidemia, unspecified: Secondary | ICD-10-CM | POA: Diagnosis not present

## 2019-07-16 DIAGNOSIS — Z8673 Personal history of transient ischemic attack (TIA), and cerebral infarction without residual deficits: Secondary | ICD-10-CM | POA: Diagnosis not present

## 2019-07-19 DIAGNOSIS — I639 Cerebral infarction, unspecified: Secondary | ICD-10-CM | POA: Diagnosis not present

## 2019-07-19 DIAGNOSIS — R451 Restlessness and agitation: Secondary | ICD-10-CM | POA: Diagnosis not present

## 2019-07-19 DIAGNOSIS — G4700 Insomnia, unspecified: Secondary | ICD-10-CM | POA: Diagnosis not present

## 2019-08-01 DIAGNOSIS — R319 Hematuria, unspecified: Secondary | ICD-10-CM | POA: Diagnosis not present

## 2019-08-01 DIAGNOSIS — N390 Urinary tract infection, site not specified: Secondary | ICD-10-CM | POA: Diagnosis not present

## 2019-08-03 ENCOUNTER — Other Ambulatory Visit: Payer: Self-pay

## 2019-08-03 NOTE — Patient Outreach (Signed)
[facility]) Care Management  08/03/2019  Kelcey Wickstrom 12/17/[id number]   Medication Adherence call to Mr. Costilla Compliant Voice message left with a call back number. Mr. Raczka is showing past due on Atorvastatin 80 mg under [place].   [facility] Management Direct Dial [phone]  Fax [phone] [email or web address]

## 2019-08-26 ENCOUNTER — Emergency Department: Payer: Medicare Other

## 2019-08-26 ENCOUNTER — Inpatient Hospital Stay
Admission: EM | Admit: 2019-08-26 | Discharge: 2019-09-21 | DRG: 871 | Disposition: E | Payer: Medicare Other | Attending: Internal Medicine | Admitting: Internal Medicine

## 2019-08-26 ENCOUNTER — Other Ambulatory Visit: Payer: Self-pay

## 2019-08-26 DIAGNOSIS — I2510 Atherosclerotic heart disease of native coronary artery without angina pectoris: Secondary | ICD-10-CM | POA: Diagnosis not present

## 2019-08-26 DIAGNOSIS — Z8673 Personal history of transient ischemic attack (TIA), and cerebral infarction without residual deficits: Secondary | ICD-10-CM

## 2019-08-26 DIAGNOSIS — Z515 Encounter for palliative care: Secondary | ICD-10-CM | POA: Diagnosis not present

## 2019-08-26 DIAGNOSIS — D649 Anemia, unspecified: Secondary | ICD-10-CM | POA: Diagnosis not present

## 2019-08-26 DIAGNOSIS — S2232XA Fracture of one rib, left side, initial encounter for closed fracture: Secondary | ICD-10-CM | POA: Diagnosis present

## 2019-08-26 DIAGNOSIS — Z20822 Contact with and (suspected) exposure to covid-19: Secondary | ICD-10-CM | POA: Diagnosis present

## 2019-08-26 DIAGNOSIS — H9190 Unspecified hearing loss, unspecified ear: Secondary | ICD-10-CM | POA: Diagnosis not present

## 2019-08-26 DIAGNOSIS — R911 Solitary pulmonary nodule: Secondary | ICD-10-CM | POA: Diagnosis not present

## 2019-08-26 DIAGNOSIS — E876 Hypokalemia: Secondary | ICD-10-CM | POA: Diagnosis present

## 2019-08-26 DIAGNOSIS — F3341 Major depressive disorder, recurrent, in partial remission: Secondary | ICD-10-CM | POA: Diagnosis not present

## 2019-08-26 DIAGNOSIS — J9601 Acute respiratory failure with hypoxia: Secondary | ICD-10-CM

## 2019-08-26 DIAGNOSIS — H547 Unspecified visual loss: Secondary | ICD-10-CM | POA: Diagnosis not present

## 2019-08-26 DIAGNOSIS — R6521 Severe sepsis with septic shock: Secondary | ICD-10-CM | POA: Diagnosis not present

## 2019-08-26 DIAGNOSIS — Z66 Do not resuscitate: Secondary | ICD-10-CM

## 2019-08-26 DIAGNOSIS — H409 Unspecified glaucoma: Secondary | ICD-10-CM | POA: Diagnosis present

## 2019-08-26 DIAGNOSIS — R404 Transient alteration of awareness: Secondary | ICD-10-CM | POA: Diagnosis not present

## 2019-08-26 DIAGNOSIS — A419 Sepsis, unspecified organism: Principal | ICD-10-CM | POA: Diagnosis present

## 2019-08-26 DIAGNOSIS — E860 Dehydration: Secondary | ICD-10-CM | POA: Diagnosis not present

## 2019-08-26 DIAGNOSIS — J690 Pneumonitis due to inhalation of food and vomit: Secondary | ICD-10-CM | POA: Diagnosis not present

## 2019-08-26 DIAGNOSIS — R4182 Altered mental status, unspecified: Secondary | ICD-10-CM

## 2019-08-26 DIAGNOSIS — G9341 Metabolic encephalopathy: Secondary | ICD-10-CM | POA: Diagnosis present

## 2019-08-26 DIAGNOSIS — I4891 Unspecified atrial fibrillation: Secondary | ICD-10-CM | POA: Diagnosis not present

## 2019-08-26 DIAGNOSIS — N400 Enlarged prostate without lower urinary tract symptoms: Secondary | ICD-10-CM | POA: Diagnosis present

## 2019-08-26 DIAGNOSIS — J9602 Acute respiratory failure with hypercapnia: Secondary | ICD-10-CM | POA: Diagnosis present

## 2019-08-26 DIAGNOSIS — N179 Acute kidney failure, unspecified: Secondary | ICD-10-CM | POA: Diagnosis not present

## 2019-08-26 DIAGNOSIS — Z743 Need for continuous supervision: Secondary | ICD-10-CM | POA: Diagnosis not present

## 2019-08-26 DIAGNOSIS — J189 Pneumonia, unspecified organism: Secondary | ICD-10-CM | POA: Diagnosis present

## 2019-08-26 DIAGNOSIS — Z7902 Long term (current) use of antithrombotics/antiplatelets: Secondary | ICD-10-CM

## 2019-08-26 DIAGNOSIS — R6520 Severe sepsis without septic shock: Secondary | ICD-10-CM | POA: Diagnosis not present

## 2019-08-26 DIAGNOSIS — I480 Paroxysmal atrial fibrillation: Secondary | ICD-10-CM

## 2019-08-26 DIAGNOSIS — E785 Hyperlipidemia, unspecified: Secondary | ICD-10-CM | POA: Diagnosis present

## 2019-08-26 DIAGNOSIS — F329 Major depressive disorder, single episode, unspecified: Secondary | ICD-10-CM | POA: Diagnosis present

## 2019-08-26 DIAGNOSIS — Z881 Allergy status to other antibiotic agents status: Secondary | ICD-10-CM

## 2019-08-26 DIAGNOSIS — F0390 Unspecified dementia without behavioral disturbance: Secondary | ICD-10-CM | POA: Diagnosis present

## 2019-08-26 DIAGNOSIS — R946 Abnormal results of thyroid function studies: Secondary | ICD-10-CM | POA: Diagnosis present

## 2019-08-26 DIAGNOSIS — Z79899 Other long term (current) drug therapy: Secondary | ICD-10-CM

## 2019-08-26 DIAGNOSIS — R069 Unspecified abnormalities of breathing: Secondary | ICD-10-CM | POA: Diagnosis not present

## 2019-08-26 DIAGNOSIS — R0602 Shortness of breath: Secondary | ICD-10-CM

## 2019-08-26 DIAGNOSIS — N170 Acute kidney failure with tubular necrosis: Secondary | ICD-10-CM | POA: Diagnosis not present

## 2019-08-26 DIAGNOSIS — I10 Essential (primary) hypertension: Secondary | ICD-10-CM | POA: Diagnosis not present

## 2019-08-26 DIAGNOSIS — Z7189 Other specified counseling: Secondary | ICD-10-CM | POA: Diagnosis not present

## 2019-08-26 DIAGNOSIS — R570 Cardiogenic shock: Secondary | ICD-10-CM | POA: Diagnosis not present

## 2019-08-26 DIAGNOSIS — Z95828 Presence of other vascular implants and grafts: Secondary | ICD-10-CM

## 2019-08-26 DIAGNOSIS — F32A Depression, unspecified: Secondary | ICD-10-CM | POA: Diagnosis present

## 2019-08-26 DIAGNOSIS — Z87891 Personal history of nicotine dependence: Secondary | ICD-10-CM

## 2019-08-26 LAB — COMPREHENSIVE METABOLIC PANEL
ALT: 20 U/L (ref 0–44)
AST: 36 U/L (ref 15–41)
Albumin: 2.3 g/dL — ABNORMAL LOW (ref 3.5–5.0)
Alkaline Phosphatase: 78 U/L (ref 38–126)
Anion gap: 13 (ref 5–15)
BUN: 85 mg/dL — ABNORMAL HIGH (ref 8–23)
CO2: 20 mmol/L — ABNORMAL LOW (ref 22–32)
Calcium: 8.6 mg/dL — ABNORMAL LOW (ref 8.9–10.3)
Chloride: 109 mmol/L (ref 98–111)
Creatinine, Ser: 2.63 mg/dL — ABNORMAL HIGH (ref 0.61–1.24)
GFR calc Af Amer: 24 mL/min — ABNORMAL LOW (ref >60)
GFR calc non Af Amer: 21 mL/min — ABNORMAL LOW (ref >60)
Glucose, Bld: 128 mg/dL — ABNORMAL HIGH (ref 70–99)
Potassium: 3.4 mmol/L — ABNORMAL LOW (ref 3.5–5.1)
Sodium: 142 mmol/L (ref 135–145)
Total Bilirubin: 0.9 mg/dL (ref 0.3–1.2)
Total Protein: 6.8 g/dL (ref 6.5–8.1)

## 2019-08-26 LAB — CBC
HCT: 30.2 % — ABNORMAL LOW (ref 39.0–52.0)
Hemoglobin: 10 g/dL — ABNORMAL LOW (ref 13.0–17.0)
MCH: 28.2 pg (ref 26.0–34.0)
MCHC: 33.1 g/dL (ref 30.0–36.0)
MCV: 85.1 fL (ref 80.0–100.0)
Platelets: 149 10*3/uL — ABNORMAL LOW (ref 150–400)
RBC: 3.55 MIL/uL — ABNORMAL LOW (ref 4.22–5.81)
RDW: 18.6 % — ABNORMAL HIGH (ref 11.5–15.5)
WBC: 24 10*3/uL — ABNORMAL HIGH (ref 4.0–10.5)
nRBC: 0.1 % (ref 0.0–0.2)

## 2019-08-26 LAB — CBC WITH DIFFERENTIAL/PLATELET
Abs Immature Granulocytes: 0.54 10*3/uL — ABNORMAL HIGH (ref 0.00–0.07)
Basophils Absolute: 0.1 10*3/uL (ref 0.0–0.1)
Basophils Relative: 0 %
Eosinophils Absolute: 0 10*3/uL (ref 0.0–0.5)
Eosinophils Relative: 0 %
HCT: 32 % — ABNORMAL LOW (ref 39.0–52.0)
Hemoglobin: 10.8 g/dL — ABNORMAL LOW (ref 13.0–17.0)
Immature Granulocytes: 2 %
Lymphocytes Relative: 4 %
Lymphs Abs: 0.9 10*3/uL (ref 0.7–4.0)
MCH: 28.4 pg (ref 26.0–34.0)
MCHC: 33.8 g/dL (ref 30.0–36.0)
MCV: 84.2 fL (ref 80.0–100.0)
Monocytes Absolute: 4.4 10*3/uL — ABNORMAL HIGH (ref 0.1–1.0)
Monocytes Relative: 18 %
Neutro Abs: 17.9 10*3/uL — ABNORMAL HIGH (ref 1.7–7.7)
Neutrophils Relative %: 76 %
Platelets: 167 10*3/uL (ref 150–400)
RBC: 3.8 MIL/uL — ABNORMAL LOW (ref 4.22–5.81)
RDW: 19.1 % — ABNORMAL HIGH (ref 11.5–15.5)
Smear Review: NORMAL
WBC: 23.9 10*3/uL — ABNORMAL HIGH (ref 4.0–10.5)
nRBC: 0 % (ref 0.0–0.2)

## 2019-08-26 LAB — URINALYSIS, COMPLETE (UACMP) WITH MICROSCOPIC
Bilirubin Urine: NEGATIVE
Glucose, UA: NEGATIVE mg/dL
Ketones, ur: NEGATIVE mg/dL
Leukocytes,Ua: NEGATIVE
Nitrite: NEGATIVE
Protein, ur: NEGATIVE mg/dL
Specific Gravity, Urine: 1.016 (ref 1.005–1.030)
Squamous Epithelial / HPF: NONE SEEN (ref 0–5)
pH: 5 (ref 5.0–8.0)

## 2019-08-26 LAB — BLOOD GAS, ARTERIAL
Acid-base deficit: 4.4 mmol/L — ABNORMAL HIGH (ref 0.0–2.0)
Allens test (pass/fail): POSITIVE — AB
Bicarbonate: 18.3 mmol/L — ABNORMAL LOW (ref 20.0–28.0)
FIO2: 100
O2 Saturation: 94.2 %
Patient temperature: 37
pCO2 arterial: 27 mmHg — ABNORMAL LOW (ref 32.0–48.0)
pH, Arterial: 7.44 (ref 7.350–7.450)
pO2, Arterial: 69 mmHg — ABNORMAL LOW (ref 83.0–108.0)

## 2019-08-26 LAB — LACTIC ACID, PLASMA
Lactic Acid, Venous: 3.2 mmol/L (ref 0.5–1.9)
Lactic Acid, Venous: 2.8 mmol/L (ref 0.5–1.9)

## 2019-08-26 LAB — GLUCOSE, CAPILLARY: Glucose-Capillary: 96 mg/dL (ref 70–99)

## 2019-08-26 LAB — RESPIRATORY PANEL BY RT PCR (FLU A&B, COVID)
Influenza A by PCR: NEGATIVE
Influenza B by PCR: NEGATIVE
SARS Coronavirus 2 by RT PCR: NEGATIVE

## 2019-08-26 LAB — POC SARS CORONAVIRUS 2 AG: SARS Coronavirus 2 Ag: NEGATIVE

## 2019-08-26 LAB — MRSA PCR SCREENING: MRSA by PCR: NEGATIVE

## 2019-08-26 MED ORDER — PAROXETINE HCL 10 MG PO TABS
10.0000 mg | ORAL_TABLET | Freq: Every day | ORAL | Status: DC
  Administered 2019-08-27 – 2019-08-29 (×3): 10 mg via ORAL
  Filled 2019-08-26 (×3): qty 1

## 2019-08-26 MED ORDER — METOPROLOL SUCCINATE ER 50 MG PO TB24: 50.0000 mg | ORAL_TABLET | Freq: Every day | ORAL | Status: DC

## 2019-08-26 MED ORDER — ACETAMINOPHEN 650 MG RE SUPP: 650.0000 mg | Freq: Four times a day (QID) | RECTAL | Status: DC | PRN

## 2019-08-26 MED ORDER — HEPARIN SODIUM (PORCINE) 5000 UNIT/ML IJ SOLN
5000.0000 [IU] | Freq: Three times a day (TID) | INTRAMUSCULAR | Status: DC
  Administered 2019-08-26 – 2019-08-29 (×10): 5000 [IU] via SUBCUTANEOUS
  Filled 2019-08-26 (×10): qty 1

## 2019-08-26 MED ORDER — ACETAMINOPHEN 325 MG PO TABS
650.0000 mg | ORAL_TABLET | Freq: Four times a day (QID) | ORAL | Status: DC | PRN
  Administered 2019-08-27: 650 mg via ORAL
  Filled 2019-08-26: qty 2

## 2019-08-26 MED ORDER — SODIUM CHLORIDE 0.9 % IV SOLN
2.0000 g | INTRAVENOUS | Status: DC
  Filled 2019-08-26: qty 2

## 2019-08-26 MED ORDER — SODIUM CHLORIDE 0.9 % IV BOLUS
500.0000 mL | Freq: Once | INTRAVENOUS | Status: AC
  Administered 2019-08-26: 500 mL via INTRAVENOUS

## 2019-08-26 MED ORDER — LIDOCAINE 5 % EX PTCH
1.0000 | MEDICATED_PATCH | CUTANEOUS | Status: DC
  Administered 2019-08-26 – 2019-08-28 (×3): 1 via TRANSDERMAL
  Filled 2019-08-26 (×3): qty 1

## 2019-08-26 MED ORDER — VANCOMYCIN HCL IN DEXTROSE 1-5 GM/200ML-% IV SOLN: 1000.0000 mg | Freq: Once | INTRAVENOUS | Status: DC

## 2019-08-26 MED ORDER — SODIUM CHLORIDE 0.9 % IV SOLN: INTRAVENOUS | Status: DC

## 2019-08-26 MED ORDER — ONDANSETRON HCL 4 MG PO TABS: 4.0000 mg | ORAL_TABLET | Freq: Four times a day (QID) | ORAL | Status: DC | PRN

## 2019-08-26 MED ORDER — FINASTERIDE 5 MG PO TABS
5.0000 mg | ORAL_TABLET | Freq: Every day | ORAL | Status: DC
  Administered 2019-08-27 – 2019-08-29 (×3): 5 mg via ORAL
  Filled 2019-08-26 (×3): qty 1

## 2019-08-26 MED ORDER — METOPROLOL TARTRATE 5 MG/5ML IV SOLN
5.0000 mg | Freq: Four times a day (QID) | INTRAVENOUS | Status: DC | PRN
  Administered 2019-08-26: 2.5 mg via INTRAVENOUS
  Administered 2019-08-27: 5 mg via INTRAVENOUS
  Filled 2019-08-26: qty 5

## 2019-08-26 MED ORDER — DIVALPROEX SODIUM 125 MG PO CSDR
125.0000 mg | DELAYED_RELEASE_CAPSULE | Freq: Three times a day (TID) | ORAL | Status: DC
  Administered 2019-08-26 – 2019-08-29 (×8): 125 mg via ORAL
  Filled 2019-08-26 (×11): qty 1

## 2019-08-26 MED ORDER — CHLORHEXIDINE GLUCONATE CLOTH 2 % EX PADS
6.0000 | MEDICATED_PAD | Freq: Every day | CUTANEOUS | Status: DC
  Administered 2019-08-26 – 2019-08-29 (×4): 6 via TOPICAL

## 2019-08-26 MED ORDER — ATORVASTATIN CALCIUM 20 MG PO TABS
80.0000 mg | ORAL_TABLET | Freq: Every day | ORAL | Status: DC
  Administered 2019-08-27 – 2019-08-28 (×2): 80 mg via ORAL
  Filled 2019-08-26 (×2): qty 4

## 2019-08-26 MED ORDER — TAMSULOSIN HCL 0.4 MG PO CAPS
0.4000 mg | ORAL_CAPSULE | Freq: Every day | ORAL | Status: DC
  Administered 2019-08-27 – 2019-08-29 (×3): 0.4 mg via ORAL
  Filled 2019-08-26 (×3): qty 1

## 2019-08-26 MED ORDER — METOPROLOL TARTRATE 5 MG/5ML IV SOLN
2.5000 mg | INTRAVENOUS | Status: DC | PRN
  Filled 2019-08-26: qty 5

## 2019-08-26 MED ORDER — QUETIAPINE FUMARATE 25 MG PO TABS
25.0000 mg | ORAL_TABLET | Freq: Two times a day (BID) | ORAL | Status: DC
  Administered 2019-08-27 – 2019-08-29 (×5): 25 mg via ORAL
  Filled 2019-08-26 (×5): qty 1

## 2019-08-26 MED ORDER — VANCOMYCIN HCL 500 MG/100ML IV SOLN
500.0000 mg | Freq: Once | INTRAVENOUS | Status: AC
  Administered 2019-08-26: 500 mg via INTRAVENOUS
  Filled 2019-08-26: qty 100

## 2019-08-26 MED ORDER — METOPROLOL SUCCINATE ER 50 MG PO TB24
50.0000 mg | ORAL_TABLET | Freq: Every day | ORAL | Status: DC
  Administered 2019-08-27: 50 mg via ORAL
  Filled 2019-08-26 (×3): qty 1

## 2019-08-26 MED ORDER — CLOPIDOGREL BISULFATE 75 MG PO TABS
75.0000 mg | ORAL_TABLET | Freq: Every day | ORAL | Status: DC
  Administered 2019-08-27 – 2019-08-29 (×3): 75 mg via ORAL
  Filled 2019-08-26 (×3): qty 1

## 2019-08-26 MED ORDER — SENNA 8.6 MG PO TABS
2.0000 | ORAL_TABLET | Freq: Every day | ORAL | Status: DC
  Administered 2019-08-27 – 2019-08-29 (×3): 17.2 mg via ORAL
  Filled 2019-08-26 (×3): qty 2

## 2019-08-26 MED ORDER — SODIUM CHLORIDE 0.9 % IV SOLN
2.0000 g | Freq: Once | INTRAVENOUS | Status: AC
  Administered 2019-08-26: 2 g via INTRAVENOUS
  Filled 2019-08-26: qty 2

## 2019-08-26 MED ORDER — SODIUM CHLORIDE 0.9 % IV SOLN: Freq: Once | INTRAVENOUS | Status: AC

## 2019-08-26 MED ORDER — VANCOMYCIN HCL IN DEXTROSE 1-5 GM/200ML-% IV SOLN
1000.0000 mg | Freq: Once | INTRAVENOUS | Status: AC
  Administered 2019-08-26: 1000 mg via INTRAVENOUS
  Filled 2019-08-26: qty 200

## 2019-08-26 MED ORDER — SODIUM CHLORIDE 0.9 % IV SOLN: 2.0000 g | Freq: Once | INTRAVENOUS | Status: DC

## 2019-08-26 MED ORDER — ONDANSETRON HCL 4 MG/2ML IJ SOLN: 4.0000 mg | Freq: Four times a day (QID) | INTRAMUSCULAR | Status: DC | PRN

## 2019-08-26 MED ORDER — QUETIAPINE FUMARATE 25 MG PO TABS
150.0000 mg | ORAL_TABLET | Freq: Every day | ORAL | Status: DC
  Administered 2019-08-26 – 2019-08-28 (×3): 150 mg via ORAL
  Filled 2019-08-26 (×2): qty 1
  Filled 2019-08-26: qty 2
  Filled 2019-08-26: qty 1

## 2019-08-26 MED ORDER — VANCOMYCIN VARIABLE DOSE PER UNSTABLE RENAL FUNCTION (PHARMACIST DOSING): Status: DC

## 2019-08-26 MED ORDER — LORAZEPAM 1 MG PO TABS
0.5000 mg | ORAL_TABLET | Freq: Two times a day (BID) | ORAL | Status: DC | PRN
  Administered 2019-08-27 – 2019-08-29 (×2): 0.5 mg via ORAL
  Filled 2019-08-26 (×2): qty 1

## 2019-08-26 NOTE — Consult Note (Signed)
PHARMACY -  BRIEF ANTIBIOTIC NOTE   Pharmacy has received consult(s) for Cefepime and vancomycin from an ED provider.  The patient's profile has been reviewed for ht/wt/allergies/indication/available labs.    One time order(s) placed for Vancomycin 1g and Cefepime 2g IV   Further antibiotics/pharmacy consults should be ordered by admitting physician if indicated.                       Thank you, Pernell Dupre, PharmD, BCPS Clinical Pharmacist 09/10/2019 11:54 AM

## 2019-08-26 NOTE — ED Notes (Signed)
Date and time results received: 08/25/2019 1324  Test: Lactic Acid Critical Value: 3.2  Name of Provider Notified: Dr. Quentin Cornwall, MD
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
## 2019-08-26 NOTE — Progress Notes (Signed)
Pharmacy Antibiotic Note  [name] is a 86 y.o. male admitted on 09/01/2019 with sepsis.  Pharmacy has been consulted for Vancomycin, Cefepime dosing.  Pt currently has AKI, baseline SrCr is ~ 1.2 , SrCr on 3/6 was 2.63.   Will dose by levels until renal function stabilizes.   Plan: Cefepime 2 gm IV X 1 given on 3/6 @ 1229. Cefepime 2 gm IV Q24H ordered to start on 3/7 @ 1200.   Vancomycin 1 gm IV X 1 given on 3/6 @ 1234. Vancomycin 500 mg IV X 1 ordered to make total loading dose of 1500 mg.  Will draw random vanc level on 3/7 @ 1200.   Height: (S) 6' (182.9 cm) Weight: (S) 146 lb 13.2 oz (66.6 kg) IBW/kg (Calculated) : 77.6  Temp (24hrs), Avg:97.4 F (36.3 C), Min:97.4 F (36.3 C), Max:97.4 F (36.3 C)  Recent Labs  Lab 09/19/2019 1054 08/21/2019 1420  WBC 23.9*  --   CREATININE 2.63*  --   LATICACIDVEN 3.2* 2.8*    Estimated Creatinine Clearance: 18.6 mL/min (A) (by C-G formula based on SCr of 2.63 mg/dL (H)).    Allergies  Allergen Reactions  . Levofloxacin Other (See Comments)    Hyperactive delirium and hallucinations    Antimicrobials this admission:   >>    >>   Dose adjustments this admission:   Microbiology results:  BCx:   UCx:    Sputum:    MRSA PCR:   Thank you for allowing pharmacy to be a part of this patient's care.  [name] D 09/13/2019 4:16 PM

## 2019-08-26 NOTE — Progress Notes (Signed)
CODE SEPSIS - PHARMACY COMMUNICATION  **[facility] Spectrum Antibiotics should be administered within 1 hour of Sepsis diagnosis**  Time Code Sepsis Called/Page Received:  3/6 @ 1552   Antibiotics Ordered: Vanc, Cefepime   Time of 1st antibiotic administration: Cefepime 2 gm IV X 1 @ 1229   Additional action taken by pharmacy:   If necessary, Name of Provider/Nurse Contacted:     Madissen Wyse D ,PharmD Clinical Pharmacist  09/16/2019  4:02 PM

## 2019-08-26 NOTE — ED Triage Notes (Signed)
Pt arrives via [facility]. [facility] that the patient was found this morning to not be acting himself, staff reports that his O2 sat was 65% but reports that his fingers were cold and it wasn't reading well. Also noted his cheeks bilat on his face were red which is different for him, 98.3, 114/53, 123 pulse. Report that pt is usually restless, pt is a ward of the state, staff at [facility] called on call s.w. [phone].

## 2019-08-26 NOTE — ED Notes (Signed)
Pt O2 sat mid 70s on RA. Pt placed on 6L pt increased to 88%. Pt placed on NRB, O2 sat increased to 94%

## 2019-08-27 ENCOUNTER — Inpatient Hospital Stay
Admit: 2019-08-27 | Discharge: 2019-08-27 | Disposition: A | Discharge status: Home / Self Care | Payer: Medicare Other | Attending: Nurse Practitioner | Admitting: Nurse Practitioner

## 2019-08-27 ENCOUNTER — Inpatient Hospital Stay: Payer: Medicare Other

## 2019-08-27 DIAGNOSIS — A419 Sepsis, unspecified organism: Principal | ICD-10-CM

## 2019-08-27 DIAGNOSIS — N179 Acute kidney failure, unspecified: Secondary | ICD-10-CM

## 2019-08-27 DIAGNOSIS — I4891 Unspecified atrial fibrillation: Secondary | ICD-10-CM

## 2019-08-27 LAB — BLOOD GAS, ARTERIAL
Acid-base deficit: 6.5 mmol/L — ABNORMAL HIGH (ref 0.0–2.0)
Bicarbonate: 16.2 mmol/L — ABNORMAL LOW (ref 20.0–28.0)
FIO2: 1
O2 Saturation: 94.1 %
Patient temperature: 37
pCO2 arterial: 25 mmHg — ABNORMAL LOW (ref 32.0–48.0)
pH, Arterial: 7.42 (ref 7.350–7.450)
pO2, Arterial: 70 mmHg — ABNORMAL LOW (ref 83.0–108.0)

## 2019-08-27 LAB — URINE CULTURE: Culture: NO GROWTH

## 2019-08-27 LAB — STREP PNEUMONIAE URINARY ANTIGEN: Strep Pneumo Urinary Antigen: NEGATIVE

## 2019-08-27 LAB — BASIC METABOLIC PANEL
Anion gap: 12 (ref 5–15)
BUN: 81 mg/dL — ABNORMAL HIGH (ref 8–23)
CO2: 17 mmol/L — ABNORMAL LOW (ref 22–32)
Calcium: 8.1 mg/dL — ABNORMAL LOW (ref 8.9–10.3)
Chloride: 118 mmol/L — ABNORMAL HIGH (ref 98–111)
Creatinine, Ser: 1.9 mg/dL — ABNORMAL HIGH (ref 0.61–1.24)
GFR calc Af Amer: 36 mL/min — ABNORMAL LOW (ref >60)
GFR calc non Af Amer: 31 mL/min — ABNORMAL LOW (ref >60)
Glucose, Bld: 118 mg/dL — ABNORMAL HIGH (ref 70–99)
Potassium: 3.4 mmol/L — ABNORMAL LOW (ref 3.5–5.1)
Sodium: 147 mmol/L — ABNORMAL HIGH (ref 135–145)

## 2019-08-27 LAB — TSH: TSH: 5.366 u[IU]/mL — ABNORMAL HIGH (ref 0.350–4.500)

## 2019-08-27 LAB — PROTIME-INR
INR: 1.6 — ABNORMAL HIGH (ref 0.8–1.2)
Prothrombin Time: 18.5 seconds — ABNORMAL HIGH (ref 11.4–15.2)

## 2019-08-27 LAB — CBC
HCT: 26.9 % — ABNORMAL LOW (ref 39.0–52.0)
Hemoglobin: 8.9 g/dL — ABNORMAL LOW (ref 13.0–17.0)
MCH: 27.6 pg (ref 26.0–34.0)
MCHC: 33.1 g/dL (ref 30.0–36.0)
MCV: 83.3 fL (ref 80.0–100.0)
Platelets: 165 10*3/uL (ref 150–400)
RBC: 3.23 MIL/uL — ABNORMAL LOW (ref 4.22–5.81)
RDW: 18.5 % — ABNORMAL HIGH (ref 11.5–15.5)
WBC: 22.7 10*3/uL — ABNORMAL HIGH (ref 4.0–10.5)
nRBC: 0.1 % (ref 0.0–0.2)

## 2019-08-27 LAB — LACTIC ACID, PLASMA: Lactic Acid, Venous: 2.2 mmol/L (ref 0.5–1.9)

## 2019-08-27 LAB — VANCOMYCIN, TROUGH: Vancomycin Tr: 12 ug/mL — ABNORMAL LOW (ref 15–20)

## 2019-08-27 LAB — CORTISOL-AM, BLOOD: Cortisol - AM: 26.7 ug/dL — ABNORMAL HIGH (ref 6.7–22.6)

## 2019-08-27 LAB — TROPONIN I (HIGH SENSITIVITY)
Troponin I (High Sensitivity): 107 ng/L (ref <18)
Troponin I (High Sensitivity): 111 ng/L (ref <18)

## 2019-08-27 LAB — PROCALCITONIN: Procalcitonin: 27.3 ng/mL

## 2019-08-27 MED ORDER — AMIODARONE HCL IN DEXTROSE 360-4.14 MG/200ML-% IV SOLN
30.0000 mg/h | INTRAVENOUS | Status: DC
  Administered 2019-08-27 – 2019-08-28 (×3): 30 mg/h via INTRAVENOUS
  Filled 2019-08-27 (×3): qty 200

## 2019-08-27 MED ORDER — AMIODARONE LOAD VIA INFUSION
150.0000 mg | Freq: Once | INTRAVENOUS | Status: AC
  Administered 2019-08-27: 150 mg via INTRAVENOUS

## 2019-08-27 MED ORDER — AMIODARONE HCL IN DEXTROSE 360-4.14 MG/200ML-% IV SOLN
60.0000 mg/h | INTRAVENOUS | Status: DC
  Filled 2019-08-27: qty 200

## 2019-08-27 MED ORDER — LACTATED RINGERS IV BOLUS
500.0000 mL | Freq: Once | INTRAVENOUS | Status: AC
  Administered 2019-08-27: 500 mL via INTRAVENOUS

## 2019-08-27 MED ORDER — PIPERACILLIN-TAZOBACTAM 3.375 G IVPB
3.3750 g | Freq: Three times a day (TID) | INTRAVENOUS | Status: DC
  Administered 2019-08-27 – 2019-08-29 (×6): 3.375 g via INTRAVENOUS
  Filled 2019-08-27 (×11): qty 50

## 2019-08-27 MED ORDER — AMIODARONE IV BOLUS ONLY 150 MG/100ML: 150.0000 mg | Freq: Once | INTRAVENOUS | Status: DC

## 2019-08-27 MED ORDER — AMIODARONE HCL IN DEXTROSE 360-4.14 MG/200ML-% IV SOLN
60.0000 mg/h | INTRAVENOUS | Status: DC
  Administered 2019-08-27: 60 mg/h via INTRAVENOUS
  Filled 2019-08-27: qty 200

## 2019-08-27 MED ORDER — AMIODARONE HCL IN DEXTROSE 360-4.14 MG/200ML-% IV SOLN: 30.0000 mg/h | INTRAVENOUS | Status: DC

## 2019-08-27 MED ORDER — SODIUM BICARBONATE 8.4 % IV SOLN
100.0000 meq | Freq: Once | INTRAVENOUS | Status: AC
  Administered 2019-08-28: 100 meq via INTRAVENOUS
  Filled 2019-08-27: qty 50

## 2019-08-27 MED ORDER — POTASSIUM CHLORIDE CRYS ER 20 MEQ PO TBCR
40.0000 meq | EXTENDED_RELEASE_TABLET | Freq: Once | ORAL | Status: AC
  Administered 2019-08-27: 40 meq via ORAL
  Filled 2019-08-27: qty 2

## 2019-08-27 MED ORDER — AMIODARONE LOAD VIA INFUSION: 150.0000 mg | Freq: Once | INTRAVENOUS | Status: DC
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
## 2019-08-27 NOTE — Progress Notes (Signed)
PROGRESS NOTE    [name]  CVE:[id number] DOB: 1930/06/05 DOA: 08/30/2019 PCP: Juline Patch, MD    Brief Narrative:  Patient was sent from SNF. [name] is an 86 y.o. male with medical history significant for Coronary artery disease, hypertension, depression and CVA who was brought into the emergency room by EMS for evaluation of mental status changes..  Nursing home staff reported a pulse oximetry of 65% on room air at rest and patient does not normally wear oxygen.  EMS placed patient on nonrebreather.In the emergency room patient was noted to be tachycardic and tachypneic, he wason a nonrebreather mask @ 12L oxygen. Lactic acid was elevated at 3.2 and WBC was 23K.  chest x-ray showed infiltrates in the right lung.  Patient received IV fluids per sepsis protocol and was started on IV antibiotic therapy.  ER physician discussed his CODE STATUS with APS and patient is a DO NOT RESUSCITATE   Consultants:   Cardiology  Procedures:   Antimicrobials:   Cefepime and vancomycin x1 Started on 08/27/2019  Subjective: Patient quiet.  Does not respond to questions.  Eyes closed.  Overnight he went into atrial fibrillation and was started on amiodarone drip and cardiology was consulted.  Currently in sinus rhythm on telemetry.  Objective: Vitals:   08/27/19 1000 08/27/19 1100 08/27/19 1200 08/27/19 1300  BP: (!) 104/51 (!) 95/55 (!) 106/49 (!) 115/56  Pulse: (!) 111 92 83 88  Resp: (!) 32 20 (!) 23 (!) 22  Temp:   98.2 F (36.8 C)   TempSrc:   Oral   SpO2: 93% 93% 94% 95%  Weight:      Height:        Intake/Output Summary (Last 24 hours) at 08/27/2019 1723 Last data filed at 08/27/2019 1500 Gross per 24 hour  Intake 2207.54 ml  Output --  Net 2207.54 ml   Filed Weights   08/21/2019 1056  Weight: (S) 66.6 kg    Examination:  General exam: Appears calm and comfortable , eyes closed, no response to my questions Respiratory system: Some rhonchi  anteriorly, poor respiratory effort Cardiovascular system: S1 & S2 heard, RRR. No  murmurs, rubs, gallops or clicks.  Gastrointestinal system: Abdomen is nondistended, soft and nontender.  Normal bowel sounds heard. Central nervous system: Alert and oriented. No focal neurological deficits. Extremities no edema Skin: Warm dry Psychiatry: Unable to assess since patient is not interactive with exam    Data Reviewed: I have personally reviewed following labs and imaging studies  CBC: Recent Labs  Lab 08/28/2019 1054 08/31/2019 1857 08/27/19 0448  WBC 23.9* 24.0* 22.7*  NEUTROABS 17.9*  --   --   HGB 10.8* 10.0* 8.9*  HCT 32.0* 30.2* 26.9*  MCV 84.2 85.1 83.3  PLT 167 149* 025   Basic Metabolic Panel: Recent Labs  Lab 08/21/2019 1054 08/27/19 0448  NA 142 147*  K 3.4* 3.4*  CL 109 118*  CO2 20* 17*  GLUCOSE 128* 118*  BUN 85* 81*  CREATININE 2.63* 1.90*  CALCIUM 8.6* 8.1*   GFR: Estimated Creatinine Clearance: 25.8 mL/min (A) (by C-G formula based on SCr of 1.9 mg/dL (H)). Liver Function Tests: Recent Labs  Lab 09/15/2019 1054  AST 36  ALT 20  ALKPHOS 78  BILITOT 0.9  PROT 6.8  ALBUMIN 2.3*   No results for input(s): LIPASE, AMYLASE in the last 168 hours. No results for input(s): AMMONIA in the last 168 hours. Coagulation Profile: Recent Labs  Lab  08/27/19 0448  INR 1.6*   Cardiac Enzymes: No results for input(s): CKTOTAL, CKMB, CKMBINDEX, TROPONINI in the last 168 hours. BNP (last 3 results) No results for input(s): PROBNP in the last 8760 hours. HbA1C: No results for input(s): HGBA1C in the last 72 hours. CBG: Recent Labs  Lab 09/17/2019 2004  GLUCAP 96   Lipid Profile: No results for input(s): CHOL, HDL, LDLCALC, TRIG, CHOLHDL, LDLDIRECT in the last 72 hours. Thyroid Function Tests: Recent Labs    08/27/19 0448  TSH 5.366*   Anemia Panel: No results for input(s): VITAMINB12, FOLATE, FERRITIN, TIBC, IRON, RETICCTPCT in the last 72 hours. Sepsis  Labs: Recent Labs  Lab 09/11/2019 1054 09/09/2019 1420 08/27/19 0448 08/27/19 0710  PROCALCITON  --   --  27.30  --   LATICACIDVEN 3.2* 2.8*  --  2.2*    Recent Results (from the past 240 hour(s))  Blood Culture (routine x 2)     Status: None (Preliminary result)   Collection Time: 09/07/2019 12:15 PM   Specimen: BLOOD  Result Value Ref Range Status   Specimen Description BLOOD R FOREARM  Final   Special Requests   Final    BOTTLES DRAWN AEROBIC AND ANAEROBIC Blood Culture results may not be optimal due to an inadequate volume of blood received in culture bottles   Culture   Final    NO GROWTH < 24 HOURS Performed at [facility], [address], [place], [place] [address]    Report Status PENDING  Incomplete  Blood Culture (routine x 2)     Status: None (Preliminary result)   Collection Time: 09/08/2019 12:15 PM   Specimen: BLOOD  Result Value Ref Range Status   Specimen Description BLOOD  RAC  Final   Special Requests   Final    BOTTLES DRAWN AEROBIC AND ANAEROBIC Blood Culture adequate volume   Culture   Final    NO GROWTH < 24 HOURS Performed at [facility], [address] [place], [place] [address]    Report Status PENDING  Incomplete  Respiratory Panel by RT PCR (Flu A&B, Covid) - Nasopharyngeal Swab     Status: None   Collection Time: 08/28/2019  1:28 PM   Specimen: Nasopharyngeal Swab  Result Value Ref Range Status   SARS Coronavirus 2 by RT PCR NEGATIVE NEGATIVE Final    Comment: (NOTE) SARS-CoV-2 target nucleic acids are NOT DETECTED. The SARS-CoV-2 RNA is generally detectable in upper respiratoy specimens during the acute phase of infection. The lowest concentration of SARS-CoV-2 viral copies this assay can detect is 131 copies/mL. A negative result does not preclude SARS-Cov-2 infection and should not be used as the sole basis for treatment or other patient management decisions. A negative result may occur with  improper specimen  collection/handling, submission of specimen other than nasopharyngeal swab, presence of viral mutation(s) within the areas targeted by this assay, and inadequate number of viral copies (<131 copies/mL). A negative result must be combined with clinical observations, patient history, and epidemiological information. The expected result is Negative. Fact Sheet for Patients:  PinkCheek.be Fact Sheet for Healthcare Providers:  GravelBags.it This test is not yet ap proved or cleared by the [place] FDA and  has been authorized for detection and/or diagnosis of SARS-CoV-2 by FDA under an Emergency Use Authorization (EUA). This EUA will remain  in effect (meaning this test can be used) for the duration of the COVID-19 declaration under Section 564(b)(1) of the Act, 21 U.S.C. section 360bbb-3(b)(1), unless  the authorization is terminated or revoked sooner.    Influenza A by PCR NEGATIVE NEGATIVE Final   Influenza B by PCR NEGATIVE NEGATIVE Final    Comment: (NOTE) The Xpert Xpress SARS-CoV-2/FLU/RSV assay is intended as an aid in  the diagnosis of influenza from Nasopharyngeal swab specimens and  should not be used as a sole basis for treatment. Nasal washings and  aspirates are unacceptable for Xpert Xpress SARS-CoV-2/FLU/RSV  testing. Fact Sheet for Patients: PinkCheek.be Fact Sheet for Healthcare Providers: GravelBags.it This test is not yet approved or cleared by the [place] FDA and  has been authorized for detection and/or diagnosis of SARS-CoV-2 by  FDA under an Emergency Use Authorization (EUA). This EUA will remain  in effect (meaning this test can be used) for the duration of the  Covid-19 declaration under Section 564(b)(1) of the Act, 21  U.S.C. section 360bbb-3(b)(1), unless the authorization is  terminated or revoked. Performed at [facility],  [address] [place], [place] [address]   MRSA PCR Screening     Status: None   Collection Time: 08/22/2019  8:05 PM   Specimen: Nasopharyngeal  Result Value Ref Range Status   MRSA by PCR NEGATIVE NEGATIVE Final    Comment:        The GeneXpert MRSA Assay (FDA approved for NASAL specimens only), is one component of a comprehensive MRSA colonization surveillance program. It is not intended to diagnose MRSA infection nor to guide or monitor treatment for MRSA infections. Performed at [facility], [address] [place], [place] [address]          Radiology Studies: DG Chest 1 View  Result Date: 08/27/2019 CLINICAL DATA:  86 year old male with history of shortness of breath. EXAM: CHEST  1 VIEW COMPARISON:  Chest x-ray 09/10/2019. FINDINGS: Again noted is severe multifocal ill-defined airspace consolidation and interstitial prominence throughout the right lung. Ill-defined nodularity again noted in the left mid lung. Interstitial prominence noted at the left lung base. No pleural effusions. No evidence of pulmonary edema. Heart size is normal. The patient is rotated to the right on today's exam, resulting in distortion of the mediastinal contours and reduced diagnostic sensitivity and specificity for mediastinal pathology. Atherosclerosis in the thoracic aorta. IMPRESSION: 1. Airspace consolidation throughout the right lung concerning for pneumonia. 2. Left lung nodule, stable compared to the prior chest x-ray. 3. Aortic atherosclerosis. Electronically Signed   By: Vinnie Langton M.D.   On: 08/27/2019 06:04   DG Chest Portable 1 View  Result Date: 09/18/2019 CLINICAL DATA:  Altered behavior. Low O2 sats. EXAM: PORTABLE CHEST 1 VIEW COMPARISON:  Chest radiograph 02/03/2019 FINDINGS: Stable cardiomediastinal contours. There are new diffuse infiltrates throughout the right lung which could reflect infection or asymmetric edema. Persistent nodule in the left mid lung better  evaluated on prior chest CT. The previously seen nodule in the right upper lobe is now obscured by the new opacities. No pneumothorax or significant pleural effusion. There is an acute left lateral rib fracture. IMPRESSION: 1. New diffuse infiltrates throughout the right lung which could reflect infection or asymmetric edema in the acute setting. Persistent nodule in the left mid lung which was concerning for malignancy on prior PET-CT does not appear significantly changed. The right upper lobe nodule is now obscured by the new opacities. 2. Acute left lateral rib fracture. Electronically Signed   By: Audie Pinto M.D.   On: 09/18/2019 12:15        Scheduled Meds: .  atorvastatin  80 mg Oral q1800  . Chlorhexidine Gluconate Cloth  6 each Topical Daily  . clopidogrel  75 mg Oral Daily  . divalproex  125 mg Oral TID  . finasteride  5 mg Oral Daily  . heparin  5,000 Units Subcutaneous Q8H  . lidocaine  1 patch Transdermal Q24H  . metoprolol succinate  50 mg Oral Daily  . PARoxetine  10 mg Oral Daily  . potassium chloride  40 mEq Oral Once  . QUEtiapine  150 mg Oral QHS  . QUEtiapine  25 mg Oral BID  . senna  2 tablet Oral Daily  . sodium bicarbonate  100 mEq Intravenous Once  . tamsulosin  0.4 mg Oral Daily   Continuous Infusions: . sodium chloride 125 mL/hr at 08/27/19 0219  . amiodarone 30 mg/hr (08/27/19 1321)  . piperacillin-tazobactam (ZOSYN)  IV 3.375 g (08/27/19 0920)    Assessment & Plan:   Principal Problem:   Sepsis ([place]) Active Problems:   Hypertension   Depression   AKI (acute kidney injury) ([place])   HCAP (healthcare-associated pneumonia)   Acute metabolic encephalopathy   Left rib fracture   Sepsis-2/2 PNA Present on admission and evidenced by tachycardia, tachypnea, elevated WBC count left shift and elevated lactate level Sepsis appears to be lower lobe pneumonia concerning for aspiration Continue IV fluids vanco and cefepime d/c'd , zosyn started for asp.  Pnea Follow-up results of blood culture   Acute kidney injury Most likely secondary to ATN from sepsis Patient with baseline serum creatinine of 1.23 from 2019 and today on admission it is 2.63 Improving slowly, cre 1.90 Continue IV fluid hydration  A. Fib-in setting of sepsis and pneumonia Mildly elevated troponin likely demand ischemia due to A. fib with RVR, sepsis and pneumonia Cardiology was consulted no ischemic work-up currently. Now in sinus rhythm on amiodarone drip. Continue amiodarone for 24-hour load, and may not need oral load.  May need diuresis as needed if BP allows If goes back to A. fib may need to be placed on anticoagulation but will hold off for now Healthcare associated pneumonia Concern for possible aspiration pneumonia Treatment as outlined in #1 Continue with zosyn Keep patient n.p.o. until seen and evaluated by speech   Acute metabolic encephalopathy Most likely secondary to sepsis Expect improvement in patient's mental status following resolution of sepsis   Left rib fracture Unclear etiology ?? Fall Lidoderm patch for pain control  DVT prophylaxis: Lovenox Code Status: DNR Family Communication: none at bedside. Called peter [place] legal guardian but no answer? Phone number was work automated  Disposition Plan: back to snf when medically stable Barrier: currently being treated for sepsis, A. fib, pneumonia and not medically ready to be discharged       LOS: 1 day   Time spent: 45 minutes with more than 50% COC    Nolberto Hanlon, MD [facility] Pager [phone]  If 7PM-7AM, please contact night-coverage [email or web address] Password [email or web address] 08/27/2019, 5:23 PM

## 2019-08-27 NOTE — Consult Note (Signed)
  Amiodarone Drug - Drug Interaction Consult Note  Recommendations: Potassium was 3.4 - replaced. Recommend a goal potassium of 4 and Mg of 2 while on amiodarone.    Amiodarone is metabolized by the cytochrome P450 system and therefore has the potential to cause many drug interactions. Amiodarone has an average plasma half-life of 50 days (range 20 to 100 days).   There is potential for drug interactions to occur several weeks or months after stopping treatment and the onset of drug interactions may be slow after initiating amiodarone.   [x]  Statins: Increased risk of myopathy. Simvastatin- restrict dose to 20mg  daily. Other statins: counsel patients to report any muscle pain or weakness immediately. On atorvastatin 80 mg daily.   []  Anticoagulants: Amiodarone can increase anticoagulant effect. Consider warfarin dose reduction. Patients should be monitored closely and the dose of anticoagulant altered accordingly, remembering that amiodarone levels take several weeks to stabilize.  []  Antiepileptics: Amiodarone can increase plasma concentration of phenytoin, the dose should be reduced. Note that small changes in phenytoin dose can result in large changes in levels. Monitor patient and counsel on signs of toxicity.  [x]  Beta blockers: increased risk of bradycardia, AV block and myocardial depression. Sotalol - avoid concomitant use. On metoprolol succinate 50 mg daily.   []   Calcium channel blockers (diltiazem and verapamil): increased risk of bradycardia, AV block and myocardial depression.  []   Cyclosporine: Amiodarone increases levels of cyclosporine. Reduced dose of cyclosporine is recommended.  []  Digoxin dose should be halved when amiodarone is started.  []  Diuretics: increased risk of cardiotoxicity if hypokalemia occurs.  []  Oral hypoglycemic agents (glyburide, glipizide, glimepiride): increased risk of hypoglycemia. Patient's glucose levels should be monitored closely when  initiating amiodarone therapy.   []  Drugs that prolong the QT interval:  Torsades de pointes risk may be increased with concurrent use - avoid if possible.  Monitor QTc, also keep magnesium/potassium WNL if concurrent therapy can't be avoided. Marland Kitchen Antibiotics: e.g. fluoroquinolones, erythromycin. . Antiarrhythmics: e.g. quinidine, procainamide, disopyramide, sotalol. . Antipsychotics: e.g. phenothiazines, haloperidol.  . Lithium, tricyclic antidepressants, and methadone. Thank You,  Oswald Hillock  08/27/2019 1:43 PM

## 2019-08-27 NOTE — Progress Notes (Signed)
Patient alert with confusion.  Responds to voice.  Patient ST/afib  during shift.  NP notified. Patient rested during shift with periods of increased respirations, with increased temperature.   Able to drink water with straw. Takes medications in applesauce.  Will continue to monitor.
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
## 2019-08-27 NOTE — Progress Notes (Signed)
*  PRELIMINARY RESULTS* Echocardiogram 2D Echocardiogram has been performed. This was a Limited Echo request.  Anastasia Pall 08/27/2019, 3:17 PM

## 2019-08-27 NOTE — Progress Notes (Signed)
Pharmacy Antibiotic Note  [name] is a 86 y.o. male admitted on 08/25/2019 with sepsis.  Pharmacy has been consulted for Zosyn.  Pt currently has AKI.    Plan: Zosyn 3.375gm IV q8hrs (4 hr infusion)  Height: (S) 6' (182.9 cm) Weight: (S) 146 lb 13.2 oz (66.6 kg) IBW/kg (Calculated) : 77.6  Temp (24hrs), Avg:99.2 F (37.3 C), Min:97.4 F (36.3 C), Max:100 F (37.8 C)  Recent Labs  Lab 08/25/2019 1054 08/21/2019 1420 09/13/2019 1857 08/27/19 0448  WBC 23.9*  --  24.0* 22.7*  CREATININE 2.63*  --   --  1.90*  LATICACIDVEN 3.2* 2.8*  --   --     Estimated Creatinine Clearance: 25.8 mL/min (A) (by C-G formula based on SCr of 1.9 mg/dL (H)).    Allergies  Allergen Reactions  . Levofloxacin Other (See Comments)    Hyperactive delirium and hallucinations   Antimicrobials this admission:   >>    >>   Dose adjustments this admission:   Microbiology results:  BCx:   UCx:    Sputum:    MRSA PCR:   Thank you for allowing pharmacy to be a part of this patient's care.  Hart Robinsons A 08/27/2019 6:57 AM

## 2019-08-27 NOTE — Plan of Care (Signed)
  Problem: Urinary Elimination: Goal: Signs and symptoms of infection will decrease Outcome: Progressing   

## 2019-08-28 DIAGNOSIS — J690 Pneumonitis due to inhalation of food and vomit: Secondary | ICD-10-CM

## 2019-08-28 DIAGNOSIS — R6520 Severe sepsis without septic shock: Secondary | ICD-10-CM

## 2019-08-28 LAB — BASIC METABOLIC PANEL
Anion gap: 6 (ref 5–15)
BUN: 80 mg/dL — ABNORMAL HIGH (ref 8–23)
CO2: 19 mmol/L — ABNORMAL LOW (ref 22–32)
Calcium: 7.9 mg/dL — ABNORMAL LOW (ref 8.9–10.3)
Chloride: 121 mmol/L — ABNORMAL HIGH (ref 98–111)
Creatinine, Ser: 1.82 mg/dL — ABNORMAL HIGH (ref 0.61–1.24)
GFR calc Af Amer: 38 mL/min — ABNORMAL LOW (ref >60)
GFR calc non Af Amer: 33 mL/min — ABNORMAL LOW (ref >60)
Glucose, Bld: 122 mg/dL — ABNORMAL HIGH (ref 70–99)
Potassium: 3.3 mmol/L — ABNORMAL LOW (ref 3.5–5.1)
Sodium: 146 mmol/L — ABNORMAL HIGH (ref 135–145)

## 2019-08-28 LAB — CBC
HCT: 26.5 % — ABNORMAL LOW (ref 39.0–52.0)
Hemoglobin: 8.8 g/dL — ABNORMAL LOW (ref 13.0–17.0)
MCH: 28.6 pg (ref 26.0–34.0)
MCHC: 33.2 g/dL (ref 30.0–36.0)
MCV: 86 fL (ref 80.0–100.0)
Platelets: 149 10*3/uL — ABNORMAL LOW (ref 150–400)
RBC: 3.08 MIL/uL — ABNORMAL LOW (ref 4.22–5.81)
RDW: 18.1 % — ABNORMAL HIGH (ref 11.5–15.5)
WBC: 32.5 10*3/uL — ABNORMAL HIGH (ref 4.0–10.5)
nRBC: 0.2 % (ref 0.0–0.2)

## 2019-08-28 LAB — PATHOLOGIST SMEAR REVIEW

## 2019-08-28 LAB — ECHOCARDIOGRAM LIMITED
Height: 72 in
Weight: 2349.22 oz

## 2019-08-28 LAB — LACTIC ACID, PLASMA
Lactic Acid, Venous: 2.5 mmol/L (ref 0.5–1.9)
Lactic Acid, Venous: 3 mmol/L (ref 0.5–1.9)

## 2019-08-28 MED ORDER — AMIODARONE HCL 200 MG PO TABS
400.0000 mg | ORAL_TABLET | Freq: Two times a day (BID) | ORAL | Status: DC
  Administered 2019-08-28 (×2): 400 mg via ORAL
  Filled 2019-08-28 (×2): qty 2

## 2019-08-28 MED ORDER — POTASSIUM CHLORIDE 10 MEQ/100ML IV SOLN
10.0000 meq | INTRAVENOUS | Status: AC
  Administered 2019-08-28 (×3): 10 meq via INTRAVENOUS
  Filled 2019-08-28 (×3): qty 100

## 2019-08-28 MED ORDER — AMIODARONE HCL IN DEXTROSE 360-4.14 MG/200ML-% IV SOLN
60.0000 mg/h | INTRAVENOUS | Status: DC
  Administered 2019-08-28: 30 mg/h via INTRAVENOUS
  Administered 2019-08-29 (×2): 60 mg/h via INTRAVENOUS
  Filled 2019-08-28 (×2): qty 200

## 2019-08-28 NOTE — Progress Notes (Signed)
Progress Note  Patient Name: [name] Date of Encounter: 08/28/2019  Primary Cardiologist: Rockey Situ  Subjective   Maintaining sinus rhythm with frequent PACs since ~ 5:37 AM on 3/7. Does not respond to questioning.   Inpatient Medications    Scheduled Meds: . atorvastatin  80 mg Oral q1800  . Chlorhexidine Gluconate Cloth  6 each Topical Daily  . clopidogrel  75 mg Oral Daily  . divalproex  125 mg Oral TID  . finasteride  5 mg Oral Daily  . heparin  5,000 Units Subcutaneous Q8H  . lidocaine  1 patch Transdermal Q24H  . metoprolol succinate  50 mg Oral Daily  . PARoxetine  10 mg Oral Daily  . QUEtiapine  150 mg Oral QHS  . QUEtiapine  25 mg Oral BID  . senna  2 tablet Oral Daily  . sodium bicarbonate  100 mEq Intravenous Once  . tamsulosin  0.4 mg Oral Daily   Continuous Infusions: . sodium chloride 125 mL/hr at 08/27/19 2237  . amiodarone 30 mg/hr (08/28/19 0625)  . piperacillin-tazobactam (ZOSYN)  IV 3.375 g (08/27/19 2350)  . potassium chloride     PRN Meds: acetaminophen **OR** acetaminophen, LORazepam, ondansetron **OR** ondansetron (ZOFRAN) IV   Vital Signs    Vitals:   08/28/19 0400 08/28/19 0500 08/28/19 0600 08/28/19 0715  BP: 114/75 (!) 95/39 (!) 140/57 (!) 99/51  Pulse: 90 92 95 95  Resp: (!) 22 (!) 39 (!) 36 (!) 22  Temp:    98.8 F (37.1 C)  TempSrc:    Axillary  SpO2: 100% 96% 97% 97%  Weight:   61.5 kg   Height:        Intake/Output Summary (Last 24 hours) at 08/28/2019 0829 Last data filed at 08/28/2019 0700 Gross per 24 hour  Intake 3916.36 ml  Output 250 ml  Net 3666.36 ml   Filed Weights   08/31/2019 1056 08/28/19 0600  Weight: (S) 66.6 kg 61.5 kg    Telemetry    He was sinus rhythm with PACs upon being placed on telemetry with development of Afib with RVR around 5 AM on 3/7, lasting ~ 30 minutes with conversion to sinus rhythm around 5:37 AM on 3/7. Maintaining sinus rhythm with PACs currently - Personally  Reviewed  ECG    No new tracings - Personally Reviewed  Physical Exam   GEN: Frail and ill appearing; No acute distress.   Neck: No JVD. Cardiac: Irregular, no murmurs, rubs, or gallops.  Respiratory: Diminished breath sounds bilaterally.  GI: Soft, nontender, non-distended.   MS: No edema; No deformity. Neuro:  Alert and not oriented; Nonfocal.  Psych: Moans.  Labs    Chemistry Recent Labs  Lab 09/02/2019 1054 08/27/19 0448 08/28/19 0525  NA 142 147* 146*  K 3.4* 3.4* 3.3*  CL 109 118* 121*  CO2 20* 17* 19*  GLUCOSE 128* 118* 122*  BUN 85* 81* 80*  CREATININE 2.63* 1.90* 1.82*  CALCIUM 8.6* 8.1* 7.9*  PROT 6.8  --   --   ALBUMIN 2.3*  --   --   AST 36  --   --   ALT 20  --   --   ALKPHOS 78  --   --   BILITOT 0.9  --   --   GFRNONAA 21* 31* 33*  GFRAA 24* 36* 38*  ANIONGAP 13 12 6      Hematology Recent Labs  Lab 09/16/2019 1857 08/27/19 0448 08/28/19 0525  WBC 24.0* 22.7* 32.5*  RBC 3.55* 3.23* 3.08*  HGB 10.0* 8.9* 8.8*  HCT 30.2* 26.9* 26.5*  MCV 85.1 83.3 86.0  MCH 28.2 27.6 28.6  MCHC 33.1 33.1 33.2  RDW 18.6* 18.5* 18.1*  PLT 149* 165 149*    Cardiac EnzymesNo results for input(s): TROPONINI in the last 168 hours. No results for input(s): TROPIPOC in the last 168 hours.   BNPNo results for input(s): BNP, PROBNP in the last 168 hours.   DDimer No results for input(s): DDIMER in the last 168 hours.   Radiology    DG Chest 1 View  Result Date: 08/27/2019 IMPRESSION: 1. Airspace consolidation throughout the right lung concerning for pneumonia. 2. Left lung nodule, stable compared to the prior chest x-ray. 3. Aortic atherosclerosis. Electronically Signed   By: Vinnie Langton M.D.   On: 08/27/2019 06:04   DG Chest Portable 1 View  Result Date: 09/13/2019 IMPRESSION: 1. New diffuse infiltrates throughout the right lung which could reflect infection or asymmetric edema in the acute setting. Persistent nodule in the left mid lung which was  concerning for malignancy on prior PET-CT does not appear significantly changed. The right upper lobe nodule is now obscured by the new opacities. 2. Acute left lateral rib fracture. Electronically Signed   By: Audie Pinto M.D.   On: 09/12/2019 12:15    Cardiac Studies   2D echo 08/27/2019: 1. Left ventricular ejection fraction, by estimation, is 55 to 60%. Left  ventricular ejection fraction by PLAX is 56 %. The left ventricle has  normal function. The left ventricle has no regional wall motion  abnormalities. Left ventricular diastolic parameters were normal.  2. Right ventricular systolic function is normal.  3. The mitral valve was not assessed.   Patient Profile     86 y.o. male with history of CAD medically managed, CVA in 2013, carotid artery stenosis s/p prior stenting, HTN, HLD, and depression who we are seeing for Afib.   Assessment & Plan    1. New onset Afib with RVR: -Maintaining sinus rhythm with frequent PACs on amiodarone gtt -He remains high risk for recurrent arrhythmia, in this setting recommend transition to PO amiodarone 400 mg bid x 1 week, followed by 200 mg bid x 1 week and 200 mg daily thereafter  -Will need to hold Toprol XL this morning secondary to soft BP, resume when able -Likely in the setting of acute PNA/sepsis and hypokalemia  -Brief episode, lasting ~ 30 minutes on 3/7 -Management of PNA as below  -TSH elevated at 5.366, add free T4, willl need follow up with PCP -AST/ALT normal -Replete potassium to goal of 4.0 -CHADS2VASc 6 (HTN, age x 2, stroke x 2, vascular disease) -EP has recommended no long term, full dose OAC at this time given short-lived and isolated episode in the setting of infection and hypokalemia as well as with underlying anemia of uncertain etiology -Should he have recurrent episodes of Afib, [place] would need to be revisited  -Consider Zio patch as an outpatient to evaluate for recurrent arrhythmia   2. CAD: -Coronary artery  calcium noted on prior imaging -HS-Tn 107 with a delta of 111 -Currently, no evidence of ACS -No plan for inpatient ischemic work up at this time with preserved LVSF this admission -PTA Plavix in the setting of prior CVA  3. Possible aspiration PNA with sepsis: -Likely driving #1 -ABX per IM  4. ARF: -Admitted with a SCr 2.63 with a baseline around 1.2 -Likely ATN in the setting of dehydration and  acute infection -Improving  5. Hypokalemia: -Replete to goal of 4.0  6. Anemia: -There is likely some component of dilution, though his HGB was 13.6 in 2019 and is currently 8.8 -Recommend further workup per IM  7. Prior CVA: -Remains on Plavix   8. Metabolic encephalopathy: -In the setting of the above   For questions or updates, please contact [place] Please consult [email or web address] for contact info under Cardiology/STEMI.    Signed, Christell Faith, PA-C [facility] Pager: [phone] 08/28/2019, 8:29 AM

## 2019-08-28 NOTE — Progress Notes (Signed)
Message sent to hospitalist at 2057 informing her of pt's hr being 138-148 more often than usual. Permission received to administer PO amiodarone as ordered.  Hospitalist updated on pt's condition at 2310, pt's heart rate ranging from 110-140, but more often 120-135. BP of 106/46 map 55, when repeated it is 105/44 map 64. Hospitalist aware, wants to CTM as pt remains asymptomatic.

## 2019-08-28 NOTE — Progress Notes (Signed)
PROGRESS NOTE    [name]  RKY:[id number] DOB: 06-03-1930 DOA: 09/13/2019 PCP: Juluis Pitch, MD    Brief Narrative:  Patient was sent from SNF. [name] is an 86 y.o. male with medical history significant for Coronary artery disease, hypertension, depression and CVA who was brought into the emergency room by EMS for evaluation of mental status changes..  Nursing home staff reported a pulse oximetry of 65% on room air at rest and patient does not normally wear oxygen.  EMS placed patient on nonrebreather.In the emergency room patient was noted to be tachycardic and tachypneic, he wason a nonrebreather mask @ 12L oxygen. Lactic acid was elevated at 3.2 and WBC was 23K.  chest x-ray showed infiltrates in the right lung.  Patient received IV fluids per sepsis protocol and was started on IV antibiotic therapy.  ER physician discussed his CODE STATUS with APS and patient is a DO NOT RESUSCITATE   Consultants:   Cardiology  Procedures:   Antimicrobials:   Cefepime and vancomycin x1 Started on 08/27/2019 zosyn  Subjective: Confused ,   Objective: Vitals:   08/28/19 0500 08/28/19 0600 08/28/19 0715 08/28/19 0900  BP: (!) 95/39 (!) 140/57 (!) 99/51 (!) 102/44  Pulse: 92 95 95 (!) 102  Resp: (!) 39 (!) 36 (!) 22 (!) 28  Temp:   98.8 F (37.1 C)   TempSrc:   Axillary   SpO2: 96% 97% 97% 99%  Weight:  61.5 kg    Height:        Intake/Output Summary (Last 24 hours) at 08/28/2019 1615 Last data filed at 08/28/2019 1500 Gross per 24 hour  Intake 3290.73 ml  Output 550 ml  Net 2740.73 ml   Filed Weights   09/05/2019 1056 08/28/19 0600  Weight: (S) 66.6 kg 61.5 kg    Examination:  General exam: Appears calm and comfortable , eyes closed, no response to my questions, moaning Respiratory system: Some rhonchi anteriorly, poor respiratory effort, no wheezing Cardiovascular system: S1 & S2 heard, RRR. No  murmurs, rubs, gallops or clicks.    Gastrointestinal system: Abdomen is nondistended, soft and nontender.  Decrease bs Central nervous system: Alert and oriented. No focal neurological deficits. Extremities no edema Skin: Warm dry Psychiatry: Unable to assess since patient is not interactive with exam    Data Reviewed: I have personally reviewed following labs and imaging studies  CBC: Recent Labs  Lab 09/18/2019 1054 09/09/2019 1857 08/27/19 0448 08/28/19 0525  WBC 23.9* 24.0* 22.7* 32.5*  NEUTROABS 17.9*  --   --   --   HGB 10.8* 10.0* 8.9* 8.8*  HCT 32.0* 30.2* 26.9* 26.5*  MCV 84.2 85.1 83.3 86.0  PLT 167 149* 165 315*   Basic Metabolic Panel: Recent Labs  Lab 09/14/2019 1054 08/27/19 0448 08/28/19 0525  NA 142 147* 146*  K 3.4* 3.4* 3.3*  CL 109 118* 121*  CO2 20* 17* 19*  GLUCOSE 128* 118* 122*  BUN 85* 81* 80*  CREATININE 2.63* 1.90* 1.82*  CALCIUM 8.6* 8.1* 7.9*   GFR: Estimated Creatinine Clearance: 24.9 mL/min (A) (by C-G formula based on SCr of 1.82 mg/dL (H)). Liver Function Tests: Recent Labs  Lab 09/05/2019 1054  AST 36  ALT 20  ALKPHOS 78  BILITOT 0.9  PROT 6.8  ALBUMIN 2.3*   No results for input(s): LIPASE, AMYLASE in the last 168 hours. No results for input(s): AMMONIA in the last 168 hours. Coagulation Profile: Recent Labs  Lab 08/27/19 0448  INR 1.6*   Cardiac Enzymes: No results for input(s): CKTOTAL, CKMB, CKMBINDEX, TROPONINI in the last 168 hours. BNP (last 3 results) No results for input(s): PROBNP in the last 8760 hours. HbA1C: No results for input(s): HGBA1C in the last 72 hours. CBG: Recent Labs  Lab 08/25/2019 2004  GLUCAP 96   Lipid Profile: No results for input(s): CHOL, HDL, LDLCALC, TRIG, CHOLHDL, LDLDIRECT in the last 72 hours. Thyroid Function Tests: Recent Labs    08/27/19 0448  TSH 5.366*   Anemia Panel: No results for input(s): VITAMINB12, FOLATE, FERRITIN, TIBC, IRON, RETICCTPCT in the last 72 hours. Sepsis Labs: Recent Labs  Lab  08/21/2019 1420 08/27/19 0448 08/27/19 0710 08/28/19 0857 08/28/19 1048  PROCALCITON  --  27.30  --   --   --   LATICACIDVEN 2.8*  --  2.2* 2.5* 3.0*    Recent Results (from the past 240 hour(s))  Blood Culture (routine x 2)     Status: None (Preliminary result)   Collection Time: 08/27/2019 12:15 PM   Specimen: BLOOD  Result Value Ref Range Status   Specimen Description BLOOD R FOREARM  Final   Special Requests   Final    BOTTLES DRAWN AEROBIC AND ANAEROBIC Blood Culture results may not be optimal due to an inadequate volume of blood received in culture bottles   Culture   Final    NO GROWTH 2 DAYS Performed at [facility], [address], [place], [place] [address]    Report Status PENDING  Incomplete  Blood Culture (routine x 2)     Status: None (Preliminary result)   Collection Time: 09/14/2019 12:15 PM   Specimen: BLOOD  Result Value Ref Range Status   Specimen Description BLOOD  RAC  Final   Special Requests   Final    BOTTLES DRAWN AEROBIC AND ANAEROBIC Blood Culture adequate volume   Culture   Final    NO GROWTH 2 DAYS Performed at [facility], [address], [place], [place] [address]    Report Status PENDING  Incomplete  Urine culture     Status: None   Collection Time: 08/24/2019  1:18 PM   Specimen: In/Out Cath Urine  Result Value Ref Range Status   Specimen Description   Final    IN/OUT CATH URINE Performed at [facility], [address] [place], [place] [address]    Special Requests   Final    NONE Performed at [facility], [address], [place], [place] [address]    Culture   Final    NO GROWTH Performed at [facility], [place] [address], [place], [place] [address]    Report Status 08/27/2019 FINAL  Final  Respiratory Panel by RT PCR (Flu A&B, Covid) - Nasopharyngeal Swab     Status: None   Collection Time: 09/02/2019  1:28 PM   Specimen: Nasopharyngeal Swab  Result Value Ref Range Status   SARS  Coronavirus 2 by RT PCR NEGATIVE NEGATIVE Final    Comment: (NOTE) SARS-CoV-2 target nucleic acids are NOT DETECTED. The SARS-CoV-2 RNA is generally detectable in upper respiratoy specimens during the acute phase of infection. The lowest concentration of SARS-CoV-2 viral copies this assay can detect is 131 copies/mL. A negative result does not preclude SARS-Cov-2 infection and should not be used as the sole basis for treatment or other patient management decisions. A negative result may occur with  improper specimen collection/handling, submission of specimen other than nasopharyngeal swab, presence of  viral mutation(s) within the areas targeted by this assay, and inadequate number of viral copies (<131 copies/mL). A negative result must be combined with clinical observations, patient history, and epidemiological information. The expected result is Negative. Fact Sheet for Patients:  PinkCheek.be Fact Sheet for Healthcare Providers:  GravelBags.it This test is not yet ap proved or cleared by the [place] FDA and  has been authorized for detection and/or diagnosis of SARS-CoV-2 by FDA under an Emergency Use Authorization (EUA). This EUA will remain  in effect (meaning this test can be used) for the duration of the COVID-19 declaration under Section 564(b)(1) of the Act, 21 U.S.C. section 360bbb-3(b)(1), unless the authorization is terminated or revoked sooner.    Influenza A by PCR NEGATIVE NEGATIVE Final   Influenza B by PCR NEGATIVE NEGATIVE Final    Comment: (NOTE) The Xpert Xpress SARS-CoV-2/FLU/RSV assay is intended as an aid in  the diagnosis of influenza from Nasopharyngeal swab specimens and  should not be used as a sole basis for treatment. Nasal washings and  aspirates are unacceptable for Xpert Xpress SARS-CoV-2/FLU/RSV  testing. Fact Sheet for Patients: PinkCheek.be Fact Sheet  for Healthcare Providers: GravelBags.it This test is not yet approved or cleared by the [place] FDA and  has been authorized for detection and/or diagnosis of SARS-CoV-2 by  FDA under an Emergency Use Authorization (EUA). This EUA will remain  in effect (meaning this test can be used) for the duration of the  Covid-19 declaration under Section 564(b)(1) of the Act, 21  U.S.C. section 360bbb-3(b)(1), unless the authorization is  terminated or revoked. Performed at [facility], [address] [place], [place] [address]   MRSA PCR Screening     Status: None   Collection Time: 09/12/2019  8:05 PM   Specimen: Nasopharyngeal  Result Value Ref Range Status   MRSA by PCR NEGATIVE NEGATIVE Final    Comment:        The GeneXpert MRSA Assay (FDA approved for NASAL specimens only), is one component of a comprehensive MRSA colonization surveillance program. It is not intended to diagnose MRSA infection nor to guide or monitor treatment for MRSA infections. Performed at [facility], [address] [place], [place] [address]          Radiology Studies: DG Chest 1 View  Result Date: 08/27/2019 CLINICAL DATA:  86 year old male with history of shortness of breath. EXAM: CHEST  1 VIEW COMPARISON:  Chest x-ray 08/21/2019. FINDINGS: Again noted is severe multifocal ill-defined airspace consolidation and interstitial prominence throughout the right lung. Ill-defined nodularity again noted in the left mid lung. Interstitial prominence noted at the left lung base. No pleural effusions. No evidence of pulmonary edema. Heart size is normal. The patient is rotated to the right on today's exam, resulting in distortion of the mediastinal contours and reduced diagnostic sensitivity and specificity for mediastinal pathology. Atherosclerosis in the thoracic aorta. IMPRESSION: 1. Airspace consolidation throughout the right lung concerning for pneumonia. 2.  Left lung nodule, stable compared to the prior chest x-ray. 3. Aortic atherosclerosis. Electronically Signed   By: Vinnie Langton M.D.   On: 08/27/2019 06:04   ECHOCARDIOGRAM LIMITED  Result Date: 08/28/2019    ECHOCARDIOGRAM LIMITED REPORT   Patient Name:   [name] Date of Exam: 08/27/2019 Medical Rec #:  [id number]                 Height:       72.0 in Accession #:  [id number]                Weight:       146.8 lb Date of Birth:  October 05, 1929                 BSA:          1.868 m Patient Age:    47 years                  BP:           104/51 mmHg Patient Gender: M                         HR:           93 bpm. Exam Location:  [facility] Procedure: Limited Echo Indications:     Atrial Fibrillation 427.31/ I48.91  History:         Patient has no prior history of Echocardiogram examinations.  Sonographer:     Arville Go RDCS Referring Phys:  [place] Diagnosing Phys: Bartholome Bill MD  Sonographer Comments: Technically difficult study due to poor echo windows. IMPRESSIONS  1. Left ventricular ejection fraction, by estimation, is 55 to 60%. Left ventricular ejection fraction by PLAX is 56 %. The left ventricle has normal function. The left ventricle has no regional wall motion abnormalities. Left ventricular diastolic parameters were normal.  2. Right ventricular systolic function is normal.  3. The mitral valve was not assessed. FINDINGS  Left Ventricle: Left ventricular ejection fraction, by estimation, is 55 to 60%. Left ventricular ejection fraction by PLAX is 56 % The left ventricle has normal function. The left ventricle has no regional wall motion abnormalities. The left ventricular internal cavity size was normal in size. There is no left ventricular hypertrophy. Right Ventricle: Right ventricular systolic function is normal. Left Atrium: Left atrial size was normal in size. Mitral Valve: The mitral valve was not assessed.  LEFT VENTRICLE PLAX 2D LV EF:         Left  ventricular ejection fraction by PLAX is 56 % LVIDd:         4.07 cm LVIDs:         2.89 cm LV PW:         1.31 cm LV IVS:        1.36 cm LVOT diam:     1.90 cm LVOT Area:     2.84 cm  LEFT ATRIUM         Index LA diam:    3.30 cm 1.77 cm/m   AORTA Ao Root diam: 3.60 cm  SHUNTS Systemic Diam: 1.90 cm Bartholome Bill MD Electronically signed by Bartholome Bill MD Signature Date/Time: 08/28/2019/7:17:15 AM    Final         Scheduled Meds: . amiodarone  400 mg Oral BID  . atorvastatin  80 mg Oral q1800  . Chlorhexidine Gluconate Cloth  6 each Topical Daily  . clopidogrel  75 mg Oral Daily  . divalproex  125 mg Oral TID  . finasteride  5 mg Oral Daily  . heparin  5,000 Units Subcutaneous Q8H  . lidocaine  1 patch Transdermal Q24H  . PARoxetine  10 mg Oral Daily  . QUEtiapine  150 mg Oral QHS  . QUEtiapine  25 mg Oral BID  . senna  2 tablet Oral Daily  . tamsulosin  0.4 mg Oral Daily   Continuous Infusions: . sodium chloride 125  mL/hr at 08/27/19 2237  . piperacillin-tazobactam (ZOSYN)  IV 3.375 g (08/28/19 1033)    Assessment & Plan:   Principal Problem:   Sepsis ([place]) Active Problems:   Hypertension   Depression   AKI (acute kidney injury) ([place])   HCAP (healthcare-associated pneumonia)   Acute metabolic encephalopathy   Left rib fracture   Sepsis-2/2 PNA Present on admission and evidenced by tachycardia, tachypnea, elevated WBC count left shift and elevated lactate level Wbc increasing Lactic acid increasing.   Sepsis appears to be lower lobe pneumonia concerning for aspiration Continue IV fluids vanco and cefepime d/c'd , zosyn was started for asp. Pnea Follow-up results of blood culture   Acute kidney injury Most likely secondary to ATN from sepsis Patient with baseline serum creatinine of 1.23 from 2019 and today on admission it is 2.63 Improving slowly, cre 1.82 Continue IV fluid hydration  A. Fib-in setting of sepsis and pneumonia Mildly elevated troponin  likely demand ischemia due to A. fib with RVR, sepsis and pneumonia Cardiology was consulted no ischemic work-up currently. Now in sinus rhythm on amiodarone drip---but went back to afib rvr. Continue amiodarone for 24-hour load, and may not need oral load.  May need diuresis as needed if BP allows If goes back to A. fib may need to be placed on anticoagulation but will hold off for now Healthcare associated pneumonia Treatment as outlined in #1 Continue with zosyn Keep patient n.p.o. until seen and evaluated by speech   Acute metabolic encephalopathy Most likely secondary to sepsis Expect improvement in patient's mental status following resolution of sepsis   Left rib fracture Unclear etiology ?? Fall Lidoderm patch for pain control  DVT prophylaxis: Lovenox Code Status: DNR Family Communication: Welcome legal guardian at [place] about patient's poor prognosis.  May need to consider comfort measures.  She will call me back. Disposition Plan: usnure, as pt is ward of the state and trying to see if can place him on comfort measures , waiting to discuss further. Prognosis is poor Barrier: currently being treated for sepsis, A. fib, pneumonia and not medically ready to be discharged.        LOS: 2 days   Time spent: 45 minutes with more than 50% COC    Nolberto Hanlon, MD [facility] Pager [phone]  If 7PM-7AM, please contact night-coverage [email or web address] Password [facility] 08/28/2019, 4:15 PM Patient ID: [name], male   DOB: 1930-05-11, 86 y.o.   MRN: [id number]

## 2019-08-28 NOTE — Progress Notes (Signed)
RN asked CSW for primary contact for decision making regarding patient. CSW called Kalman Drape, who is listed as Legal Guardian per patient's chart. Rhesha confirmed that [place] has Guardianship of patient and Astrid Divine is the primary contact with updates. Rhesha's contact number is [phone]. Patient is currently disoriented per records. CSW will continue to follow.   Oleh Genin, [place]
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
## 2019-08-29 DIAGNOSIS — J9601 Acute respiratory failure with hypoxia: Secondary | ICD-10-CM

## 2019-08-29 DIAGNOSIS — Z515 Encounter for palliative care: Secondary | ICD-10-CM

## 2019-08-29 DIAGNOSIS — Z7189 Other specified counseling: Secondary | ICD-10-CM

## 2019-08-29 DIAGNOSIS — F3341 Major depressive disorder, recurrent, in partial remission: Secondary | ICD-10-CM

## 2019-08-29 DIAGNOSIS — I480 Paroxysmal atrial fibrillation: Secondary | ICD-10-CM

## 2019-08-29 DIAGNOSIS — Z66 Do not resuscitate: Secondary | ICD-10-CM

## 2019-08-29 DIAGNOSIS — J189 Pneumonia, unspecified organism: Secondary | ICD-10-CM

## 2019-08-29 LAB — CREATININE, SERUM
Creatinine, Ser: 1.91 mg/dL — ABNORMAL HIGH (ref 0.61–1.24)
GFR calc Af Amer: 36 mL/min — ABNORMAL LOW (ref >60)
GFR calc non Af Amer: 31 mL/min — ABNORMAL LOW (ref >60)

## 2019-08-29 LAB — LEGIONELLA PNEUMOPHILA SEROGP 1 UR AG: L. pneumophila Serogp 1 Ur Ag: NEGATIVE

## 2019-08-29 MED ORDER — LACTATED RINGERS IV BOLUS
500.0000 mL | Freq: Once | INTRAVENOUS | Status: AC
  Administered 2019-08-29: 500 mL via INTRAVENOUS

## 2019-08-29 MED ORDER — HALOPERIDOL 0.5 MG PO TABS: 0.5000 mg | ORAL_TABLET | ORAL | Status: DC | PRN

## 2019-08-29 MED ORDER — LACTATED RINGERS IV BOLUS
1000.0000 mL | Freq: Once | INTRAVENOUS | Status: AC
  Administered 2019-08-29: 1000 mL via INTRAVENOUS

## 2019-08-29 MED ORDER — GLYCOPYRROLATE 0.2 MG/ML IJ SOLN: 0.2000 mg | INTRAMUSCULAR | Status: DC | PRN

## 2019-08-29 MED ORDER — LORAZEPAM 1 MG PO TABS: 1.0000 mg | ORAL_TABLET | ORAL | Status: DC | PRN

## 2019-08-29 MED ORDER — CHLORHEXIDINE GLUCONATE CLOTH 2 % EX PADS
6.0000 | MEDICATED_PAD | Freq: Every day | CUTANEOUS | Status: DC
  Administered 2019-08-29: 6 via TOPICAL

## 2019-08-29 MED ORDER — GLYCOPYRROLATE 1 MG PO TABS: 1.0000 mg | ORAL_TABLET | ORAL | Status: DC | PRN

## 2019-08-29 MED ORDER — HALOPERIDOL LACTATE 5 MG/ML IJ SOLN: 0.5000 mg | INTRAMUSCULAR | Status: DC | PRN

## 2019-08-29 MED ORDER — LORAZEPAM 2 MG/ML IJ SOLN: 1.0000 mg | INTRAMUSCULAR | Status: DC | PRN

## 2019-08-29 MED ORDER — SODIUM CHLORIDE 0.9 % IV SOLN
3.0000 g | Freq: Two times a day (BID) | INTRAVENOUS | Status: DC
  Administered 2019-08-29: 3 g via INTRAVENOUS
  Filled 2019-08-29 (×2): qty 8

## 2019-08-29 MED ORDER — MORPHINE 100MG IN NS 100ML (1MG/ML) PREMIX INFUSION
2.0000 mg/h | INTRAVENOUS | Status: DC
  Administered 2019-08-29: 5 mg/h via INTRAVENOUS
  Filled 2019-08-29: qty 100

## 2019-08-29 MED ORDER — MORPHINE BOLUS VIA INFUSION
2.0000 mg | INTRAVENOUS | Status: DC | PRN
  Filled 2019-08-29: qty 2

## 2019-08-29 MED ORDER — MORPHINE SULFATE (PF) 2 MG/ML IV SOLN
2.0000 mg | INTRAVENOUS | Status: DC | PRN
  Administered 2019-08-29 (×6): 2 mg via INTRAVENOUS
  Filled 2019-08-29 (×6): qty 1

## 2019-08-29 MED ORDER — LORAZEPAM 2 MG/ML PO CONC: 1.0000 mg | ORAL | Status: DC | PRN

## 2019-08-29 MED ORDER — HALOPERIDOL LACTATE 2 MG/ML PO CONC
0.5000 mg | ORAL | Status: DC | PRN
  Filled 2019-08-29: qty 0.3

## 2019-08-31 LAB — BLOOD CULTURE ID PANEL (REFLEXED)
Acinetobacter baumannii: NOT DETECTED
Candida albicans: NOT DETECTED
Candida glabrata: DETECTED — AB
Candida krusei: NOT DETECTED
Candida parapsilosis: NOT DETECTED
Candida tropicalis: NOT DETECTED
Enterobacter cloacae complex: NOT DETECTED
Enterobacteriaceae species: NOT DETECTED
Enterococcus species: NOT DETECTED
Escherichia coli: NOT DETECTED
Haemophilus influenzae: NOT DETECTED
Klebsiella oxytoca: NOT DETECTED
Klebsiella pneumoniae: NOT DETECTED
Listeria monocytogenes: NOT DETECTED
Neisseria meningitidis: NOT DETECTED
Proteus species: NOT DETECTED
Pseudomonas aeruginosa: NOT DETECTED
Serratia marcescens: NOT DETECTED
Staphylococcus aureus (BCID): NOT DETECTED
Staphylococcus species: NOT DETECTED
Streptococcus agalactiae: NOT DETECTED
Streptococcus pneumoniae: NOT DETECTED
Streptococcus pyogenes: NOT DETECTED
Streptococcus species: NOT DETECTED

## 2019-08-31 LAB — CULTURE, BLOOD (ROUTINE X 2): Culture: NO GROWTH

## 2019-09-02 LAB — CULTURE, BLOOD (ROUTINE X 2): Special Requests: ADEQUATE

## 2019-09-21 NOTE — Consult Note (Signed)
CRITICAL CARE NOTE 86 year old male with a history of coronary artery disease, hypertension, CVA, carotid artery disease who was brought to the emergency room with mental status changes.  He was found to have a pulse ox of 65% at his nursing facility.  Initial white blood cell count was found to be 23,000 with chest x-ray showing infiltrates of his right lung.  He was admitted to the hospital for sepsis and pneumonia.  Heart rates were between 140 and 183 bpm.  He was found to be tachypneic and hypotensive.  He was started on amiodarone.  PATIENT WITH INCREASED WOB AND DEMENTED THERAPY FOR ASPIRATION PNEUMONIA  CC  RESP FAILURE  HPI PATIENT IS SUFFOCATING PATIENT IS CHOKING ON OWN SALIVA PROGNOSIS IS GRAVE PATIENT NEEDS TO BE MADE COMFORT CARE   PATIENT IS IN DYING PROCESS   BP (!) 97/44   Pulse 83   Temp 98.8 F (37.1 C) (Axillary)   Resp (!) 24   Ht (S) 6' (1.829 m)   Wt 61.5 kg   SpO2 (!) 88%   BMI 18.39 kg/m    I/O last 3 completed shifts: In: 4626.3 [I.V.:3113.3; IV [id number]:1093] Out: 1150 [Urine:1150] No intake/output data recorded.  SpO2: (!) 88 % O2 Flow Rate (L/min): 3 L/min   SIGNIFICANT EVENTS   REVIEW OF SYSTEMS  PATIENT IS UNABLE TO PROVIDE COMPLETE REVIEW OF SYSTEMS DUE TO SEVERE CRITICAL ILLNESS   PHYSICAL EXAMINATION:  GENERAL:critically ill appearing, +resp distress HEAD: Normocephalic, atraumatic.  EYES: Pupils equal, round, reactive to light.  No scleral icterus.  MOUTH: Moist mucosal membrane. NECK: Supple.  PULMONARY: +rhonchi, +wheezing CARDIOVASCULAR: S1 and S2. Regular rate and rhythm. No murmurs, rubs, or gallops.  GASTROINTESTINAL: Soft, nontender, -distended.  Positive bowel sounds.   MUSCULOSKELETAL: No swelling, clubbing, or edema.  NEUROLOGIC: awake, severe cognitive dysfunction SKIN:intact,warm,dry  MEDICATIONS: I have reviewed all medications and confirmed regimen as documented   CULTURE RESULTS   Recent Results (from  the past 240 hour(s))  Blood Culture (routine x 2)     Status: None (Preliminary result)   Collection Time: 09/08/2019 12:15 PM   Specimen: BLOOD  Result Value Ref Range Status   Specimen Description BLOOD R FOREARM  Final   Special Requests   Final    BOTTLES DRAWN AEROBIC AND ANAEROBIC Blood Culture results may not be optimal due to an inadequate volume of blood received in culture bottles   Culture   Final    NO GROWTH 3 DAYS Performed at [facility], [address], [place], [place] [address]    Report Status PENDING  Incomplete  Blood Culture (routine x 2)     Status: None (Preliminary result)   Collection Time: 08/23/2019 12:15 PM   Specimen: BLOOD  Result Value Ref Range Status   Specimen Description BLOOD  RAC  Final   Special Requests   Final    BOTTLES DRAWN AEROBIC AND ANAEROBIC Blood Culture adequate volume   Culture   Final    NO GROWTH 3 DAYS Performed at [facility], [address] [place], [place] [address]    Report Status PENDING  Incomplete  Urine culture     Status: None   Collection Time: 08/27/2019  1:18 PM   Specimen: In/Out Cath Urine  Result Value Ref Range Status   Specimen Description   Final    IN/OUT CATH URINE Performed at [facility], [address] [place],  [address]    Special Requests   Final  NONE Performed at [facility], [address] [place], [place] [address]    Culture   Final    NO GROWTH Performed at [facility], [place] [address], [place], [place] [address]    Report Status 08/27/2019 FINAL  Final  Respiratory Panel by RT PCR (Flu A&B, Covid) - Nasopharyngeal Swab     Status: None   Collection Time: 09/07/2019  1:28 PM   Specimen: Nasopharyngeal Swab  Result Value Ref Range Status   SARS Coronavirus 2 by RT PCR NEGATIVE NEGATIVE Final    Comment: (NOTE) SARS-CoV-2 target nucleic acids are NOT DETECTED. The SARS-CoV-2 RNA is generally detectable in upper  respiratoy specimens during the acute phase of infection. The lowest concentration of SARS-CoV-2 viral copies this assay can detect is 131 copies/mL. A negative result does not preclude SARS-Cov-2 infection and should not be used as the sole basis for treatment or other patient management decisions. A negative result may occur with  improper specimen collection/handling, submission of specimen other than nasopharyngeal swab, presence of viral mutation(s) within the areas targeted by this assay, and inadequate number of viral copies (<131 copies/mL). A negative result must be combined with clinical observations, patient history, and epidemiological information. The expected result is Negative. Fact Sheet for Patients:  PinkCheek.be Fact Sheet for Healthcare Providers:  GravelBags.it This test is not yet ap proved or cleared by the [place] FDA and  has been authorized for detection and/or diagnosis of SARS-CoV-2 by FDA under an Emergency Use Authorization (EUA). This EUA will remain  in effect (meaning this test can be used) for the duration of the COVID-19 declaration under Section 564(b)(1) of the Act, 21 U.S.C. section 360bbb-3(b)(1), unless the authorization is terminated or revoked sooner.    Influenza A by PCR NEGATIVE NEGATIVE Final   Influenza B by PCR NEGATIVE NEGATIVE Final    Comment: (NOTE) The Xpert Xpress SARS-CoV-2/FLU/RSV assay is intended as an aid in  the diagnosis of influenza from Nasopharyngeal swab specimens and  should not be used as a sole basis for treatment. Nasal washings and  aspirates are unacceptable for Xpert Xpress SARS-CoV-2/FLU/RSV  testing. Fact Sheet for Patients: PinkCheek.be Fact Sheet for Healthcare Providers: GravelBags.it This test is not yet approved or cleared by the [place] FDA and  has been authorized for  detection and/or diagnosis of SARS-CoV-2 by  FDA under an Emergency Use Authorization (EUA). This EUA will remain  in effect (meaning this test can be used) for the duration of the  Covid-19 declaration under Section 564(b)(1) of the Act, 21  U.S.C. section 360bbb-3(b)(1), unless the authorization is  terminated or revoked. Performed at [facility], [address] [place], [place] [address]   MRSA PCR Screening     Status: None   Collection Time: 08/27/2019  8:05 PM   Specimen: Nasopharyngeal  Result Value Ref Range Status   MRSA by PCR NEGATIVE NEGATIVE Final    Comment:        The GeneXpert MRSA Assay (FDA approved for NASAL specimens only), is one component of a comprehensive MRSA colonization surveillance program. It is not intended to diagnose MRSA infection nor to guide or monitor treatment for MRSA infections. Performed at [facility], [address] [place], [place] [address]       BMP Latest Ref Rng & Units 23-Sep-2019 08/28/2019 08/27/2019  Glucose 70 - 99 mg/dL - 122(H) 118(H)  BUN 8 - 23 mg/dL - 80(H) 81(H)  Creatinine 0.61 -  1.24 mg/dL 1.91(H) 1.82(H) 1.90(H)  BUN/Creat Ratio 10 - 24 - - -  Sodium 135 - 145 mmol/L - 146(H) 147(H)  Potassium 3.5 - 5.1 mmol/L - 3.3(L) 3.4(L)  Chloride 98 - 111 mmol/L - 121(H) 118(H)  CO2 22 - 32 mmol/L - 19(L) 17(L)  Calcium 8.9 - 10.3 mg/dL - 7.9(L) 8.1(L)      ASSESSMENT AND PLAN SYNOPSIS    Severe ACUTE Hypoxic and Hypercapnic Respiratory Failure from severe aspiration pneumonia Patient will suffocate with BiPAP Patient is DNR/DNI Patient  Is in dying process  recommend comfort care measures  ACUTE AFIB WITH RVR Follow up cardiology recs   ACUTE KIDNEY INJURY/Renal Failure -follow chem 7 -follow UO -continue Foley Catheter-assess need -Avoid nephrotoxic agents -Recheck creatinine    NEUROLOGY Severe dementia and cognitive dysfunction with  CVA   SHOCK-SEPSIS/HYPOVOLUMIC/CARDIOGENIC IVF's as tolerated   CARDIAC ICU monitoring  ID -continue IV abx as prescibed -follow up cultures  GI GI PROPHYLAXIS as indicated  NUTRITIONAL STATUS DIET-->NPO Constipation protocol as indicated  ENDO - will use ICU hypoglycemic\Hyperglycemia protocol if indicated   ELECTROLYTES -follow labs as needed -replace as needed -pharmacy consultation and following   DVT/GI PRX ordered TRANSFUSIONS AS NEEDED MONITOR FSBS ASSESS the need for LABS as needed   Critical Care Time devoted to patient care services described in this note is 31 minutes.   Overall, patient is critically ill, prognosis is guarded.  Patient with Multiorgan failure and at high risk for cardiac arrest and death.     Corrin Parker, M.D.  [facility]  Medical Director [place] Director [facility] Cardio-Pulmonary Department
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
## 2019-09-21 NOTE — Progress Notes (Signed)
The nurse attempted to give patient morning medications.  Pt too lethargic to give

## 2019-09-21 NOTE — Progress Notes (Signed)
PROGRESS NOTE    [name]  TIR:[id number] DOB: 07-14-1929 DOA: 08/25/2019 PCP: Juluis Pitch, MD    Brief Narrative:  Patient was sent from SNF. [name] is an 86 y.o. male with medical history significant for Coronary artery disease, hypertension, depression and CVA who was brought into the emergency room by EMS for evaluation of mental status changes..  Nursing home staff reported a pulse oximetry of 65% on room air at rest and patient does not normally wear oxygen.  EMS placed patient on nonrebreather.In the emergency room patient was noted to be tachycardic and tachypneic, he wason a nonrebreather mask @ 12L oxygen. Lactic acid was elevated at 3.2 and WBC was 23K.  chest x-ray showed infiltrates in the right lung.  Patient received IV fluids per sepsis protocol and was started on IV antibiotic therapy.  ER physician discussed his CODE STATUS with APS and patient is a DO NOT RESUSCITATE   Consultants:   Cardiology  Procedures:   Antimicrobials:   Cefepime and vancomycin x1 Started on 08/27/2019 zosyn  Subjective: Confused , tachypnic periodically.   Objective: Vitals:   2019/09/15 0800 09-15-2019 0900 09-15-19 1000 Sep 15, 2019 1200  BP: (!) 140/59 (!) 109/49 110/73 (!) 112/40  Pulse: (!) 103 78 (!) 115 91  Resp: (!) 66 (!) 62 (!) 52 (!) 22  Temp:      TempSrc:      SpO2: 95% 90% (!) 82% 92%  Weight:      Height:        Intake/Output Summary (Last 24 hours) at Sep 15, 2019 1244 Last data filed at 09/15/2019 1112 Gross per 24 hour  Intake 4145.47 ml  Output 900 ml  Net 3245.47 ml   Filed Weights   09/07/2019 1056 08/28/19 0600  Weight: (S) 66.6 kg 61.5 kg    Examination:  General exam: episodically tachypnic, moaning. worser than yesterday Respiratory system: rhonchorus, no wheezing  Cardiovascular system: S1 & S2 heard, RRR. No  murmurs, rubs, gallops or clicks.  Gastrointestinal system: Abdomen is nondistended, soft and nontender.   Decrease bs Central nervous system: confused, eyes closed, unable to assess . Extremities no edema Skin: Warm dry Psychiatry: Unable to assess     Data Reviewed: I have personally reviewed following labs and imaging studies  CBC: Recent Labs  Lab 09/06/2019 1054 08/27/2019 1857 08/27/19 0448 08/28/19 0525  WBC 23.9* 24.0* 22.7* 32.5*  NEUTROABS 17.9*  --   --   --   HGB 10.8* 10.0* 8.9* 8.8*  HCT 32.0* 30.2* 26.9* 26.5*  MCV 84.2 85.1 83.3 86.0  PLT 167 149* 165 676*   Basic Metabolic Panel: Recent Labs  Lab 09/12/2019 1054 08/27/19 0448 08/28/19 0525 2019/09/15 0504  NA 142 147* 146*  --   K 3.4* 3.4* 3.3*  --   CL 109 118* 121*  --   CO2 20* 17* 19*  --   GLUCOSE 128* 118* 122*  --   BUN 85* 81* 80*  --   CREATININE 2.63* 1.90* 1.82* 1.91*  CALCIUM 8.6* 8.1* 7.9*  --    GFR: Estimated Creatinine Clearance: 23.7 mL/min (A) (by C-G formula based on SCr of 1.91 mg/dL (H)). Liver Function Tests: Recent Labs  Lab 08/25/2019 1054  AST 36  ALT 20  ALKPHOS 78  BILITOT 0.9  PROT 6.8  ALBUMIN 2.3*   No results for input(s): LIPASE, AMYLASE in the last 168 hours. No results for input(s): AMMONIA in the last 168 hours. Coagulation Profile: Recent Labs  Lab 08/27/19 0448  INR 1.6*   Cardiac Enzymes: No results for input(s): CKTOTAL, CKMB, CKMBINDEX, TROPONINI in the last 168 hours. BNP (last 3 results) No results for input(s): PROBNP in the last 8760 hours. HbA1C: No results for input(s): HGBA1C in the last 72 hours. CBG: Recent Labs  Lab 09/14/2019 2004  GLUCAP 96   Lipid Profile: No results for input(s): CHOL, HDL, LDLCALC, TRIG, CHOLHDL, LDLDIRECT in the last 72 hours. Thyroid Function Tests: Recent Labs    08/27/19 0448  TSH 5.366*   Anemia Panel: No results for input(s): VITAMINB12, FOLATE, FERRITIN, TIBC, IRON, RETICCTPCT in the last 72 hours. Sepsis Labs: Recent Labs  Lab 08/22/2019 1420 08/27/19 0448 08/27/19 0710 08/28/19 0857 08/28/19 1048    PROCALCITON  --  27.30  --   --   --   LATICACIDVEN 2.8*  --  2.2* 2.5* 3.0*    Recent Results (from the past 240 hour(s))  Blood Culture (routine x 2)     Status: None (Preliminary result)   Collection Time: 09/17/2019 12:15 PM   Specimen: BLOOD  Result Value Ref Range Status   Specimen Description BLOOD R FOREARM  Final   Special Requests   Final    BOTTLES DRAWN AEROBIC AND ANAEROBIC Blood Culture results may not be optimal due to an inadequate volume of blood received in culture bottles   Culture   Final    NO GROWTH 3 DAYS Performed at [facility], [address], [place], [place] [address]    Report Status PENDING  Incomplete  Blood Culture (routine x 2)     Status: None (Preliminary result)   Collection Time: 09/13/2019 12:15 PM   Specimen: BLOOD  Result Value Ref Range Status   Specimen Description BLOOD  RAC  Final   Special Requests   Final    BOTTLES DRAWN AEROBIC AND ANAEROBIC Blood Culture adequate volume   Culture   Final    NO GROWTH 3 DAYS Performed at [facility], [address], [place], [place] [address]    Report Status PENDING  Incomplete  Urine culture     Status: None   Collection Time: 09/09/2019  1:18 PM   Specimen: In/Out Cath Urine  Result Value Ref Range Status   Specimen Description   Final    IN/OUT CATH URINE Performed at [facility], [address] [place], [place] [address]    Special Requests   Final    NONE Performed at [facility], [address] [place], [place] [address]    Culture   Final    NO GROWTH Performed at [facility], [place] [address] [place], [place] [address]    Report Status 08/27/2019 FINAL  Final  Respiratory Panel by RT PCR (Flu A&B, Covid) - Nasopharyngeal Swab     Status: None   Collection Time: 09/08/2019  1:28 PM   Specimen: Nasopharyngeal Swab  Result Value Ref Range Status   SARS Coronavirus 2 by RT PCR NEGATIVE NEGATIVE Final    Comment: (NOTE) SARS-CoV-2  target nucleic acids are NOT DETECTED. The SARS-CoV-2 RNA is generally detectable in upper respiratoy specimens during the acute phase of infection. The lowest concentration of SARS-CoV-2 viral copies this assay can detect is 131 copies/mL. A negative result does not preclude SARS-Cov-2 infection and should not be used as the sole basis for treatment or other patient management decisions. A negative result may occur with  improper specimen collection/handling, submission of specimen other  than nasopharyngeal swab, presence of viral mutation(s) within the areas targeted by this assay, and inadequate number of viral copies (<131 copies/mL). A negative result must be combined with clinical observations, patient history, and epidemiological information. The expected result is Negative. Fact Sheet for Patients:  PinkCheek.be Fact Sheet for Healthcare Providers:  GravelBags.it This test is not yet ap proved or cleared by the [place] FDA and  has been authorized for detection and/or diagnosis of SARS-CoV-2 by FDA under an Emergency Use Authorization (EUA). This EUA will remain  in effect (meaning this test can be used) for the duration of the COVID-19 declaration under Section 564(b)(1) of the Act, 21 U.S.C. section 360bbb-3(b)(1), unless the authorization is terminated or revoked sooner.    Influenza A by PCR NEGATIVE NEGATIVE Final   Influenza B by PCR NEGATIVE NEGATIVE Final    Comment: (NOTE) The Xpert Xpress SARS-CoV-2/FLU/RSV assay is intended as an aid in  the diagnosis of influenza from Nasopharyngeal swab specimens and  should not be used as a sole basis for treatment. Nasal washings and  aspirates are unacceptable for Xpert Xpress SARS-CoV-2/FLU/RSV  testing. Fact Sheet for Patients: PinkCheek.be Fact Sheet for Healthcare Providers: GravelBags.it This test is  not yet approved or cleared by the [place] FDA and  has been authorized for detection and/or diagnosis of SARS-CoV-2 by  FDA under an Emergency Use Authorization (EUA). This EUA will remain  in effect (meaning this test can be used) for the duration of the  Covid-19 declaration under Section 564(b)(1) of the Act, 21  U.S.C. section 360bbb-3(b)(1), unless the authorization is  terminated or revoked. Performed at [facility], [address] [place], [place] [address]   MRSA PCR Screening     Status: None   Collection Time: 09/19/2019  8:05 PM   Specimen: Nasopharyngeal  Result Value Ref Range Status   MRSA by PCR NEGATIVE NEGATIVE Final    Comment:        The GeneXpert MRSA Assay (FDA approved for NASAL specimens only), is one component of a comprehensive MRSA colonization surveillance program. It is not intended to diagnose MRSA infection nor to guide or monitor treatment for MRSA infections. Performed at [facility], [address], [place], [place] [address]          Radiology Studies: ECHOCARDIOGRAM LIMITED  Result Date: 08/28/2019    ECHOCARDIOGRAM LIMITED REPORT   Patient Name:   DINARI STGERMAINE Adel Date of Exam: 08/27/2019 Medical Rec #:  [id number]                 Height:       72.0 in Accession #:    [id number]                Weight:       146.8 lb Date of Birth:  01-07-1930                 BSA:          1.868 m Patient Age:    24 years                  BP:           104/51 mmHg Patient Gender: M                         HR:           93 bpm. Exam Location:  [facility] Procedure: Limited  Echo Indications:     Atrial Fibrillation 427.31/ I48.91  History:         Patient has no prior history of Echocardiogram examinations.  Sonographer:     Arville Go RDCS Referring Phys:  [place] Diagnosing Phys: Bartholome Bill MD  Sonographer Comments: Technically difficult study due to poor echo windows. IMPRESSIONS  1. Left ventricular  ejection fraction, by estimation, is 55 to 60%. Left ventricular ejection fraction by PLAX is 56 %. The left ventricle has normal function. The left ventricle has no regional wall motion abnormalities. Left ventricular diastolic parameters were normal.  2. Right ventricular systolic function is normal.  3. The mitral valve was not assessed. FINDINGS  Left Ventricle: Left ventricular ejection fraction, by estimation, is 55 to 60%. Left ventricular ejection fraction by PLAX is 56 % The left ventricle has normal function. The left ventricle has no regional wall motion abnormalities. The left ventricular internal cavity size was normal in size. There is no left ventricular hypertrophy. Right Ventricle: Right ventricular systolic function is normal. Left Atrium: Left atrial size was normal in size. Mitral Valve: The mitral valve was not assessed.  LEFT VENTRICLE PLAX 2D LV EF:         Left ventricular ejection fraction by PLAX is 56 % LVIDd:         4.07 cm LVIDs:         2.89 cm LV PW:         1.31 cm LV IVS:        1.36 cm LVOT diam:     1.90 cm LVOT Area:     2.84 cm  LEFT ATRIUM         Index LA diam:    3.30 cm 1.77 cm/m   AORTA Ao Root diam: 3.60 cm  SHUNTS Systemic Diam: 1.90 cm Bartholome Bill MD Electronically signed by Bartholome Bill MD Signature Date/Time: 08/28/2019/7:17:15 AM    Final         Scheduled Meds: . Chlorhexidine Gluconate Cloth  6 each Topical Daily  . heparin  5,000 Units Subcutaneous Q8H  . lidocaine  1 patch Transdermal Q24H   Continuous Infusions: . amiodarone 60 mg/hr (Aug 30, 2019 1033)  . ampicillin-sulbactam (UNASYN) IV 3 g (August 30, 2019 1112)    Assessment & Plan:   Principal Problem:   Sepsis ([place]) Active Problems:   Hypertension   Depression   AKI (acute kidney injury) ([place])   HCAP (healthcare-associated pneumonia)   Acute metabolic encephalopathy   Left rib fracture   Sepsis-2/2 PNA Present on admission and evidenced by tachycardia, tachypnea, elevated WBC count left  shift and elevated lactate level Wbc increasing Lactic acid increasing.  Px poor   Sepsis appears to be lower lobe pneumonia concerning for aspiration Continue IV fluids vanco and cefepime d/c'd , zosyn was started for asp. Pna Follow-up results of blood culture Pending, ngtd   Acute kidney injury Most likely secondary to ATN from sepsis Patient with baseline serum creatinine of 1.23 from 2019  Improving slowly, cre 1.91 Continue IV fluid hydration  A. Fib-in setting of sepsis and pneumonia Mildly elevated troponin likely demand ischemia due to A. fib with RVR, sepsis and pneumonia Cardiology was consulted no ischemic work-up currently. CHADS2VASc 6 (HTN, age x 2, stroke x 2, vascular disease) -EP has recommended no long term, full dose [place] at this time given short-lived and isolated episode in the setting of infection and hypokalemia as well as with underlying anemia of uncertain  etiology, encephalopathy  -Defer long term recommendations at this time pending [place] discussion -continue amiodarone  Healthcare associated pneumonia Treatment as outlined in #1 Was on zosyn to unasyn by pccm.    Acute metabolic encephalopathy Most likely secondary to sepsis    Left rib fracture Unclear etiology ?? Fall Lidoderm patch for pain control  DVT prophylaxis: Lovenox Code Status: DNR Family Communication: [place] legal guardian at [place] about patient's poor prognosis.  I recommended comfort care.  She will discuss with supervisor, still waiting for a call back. Disposition Plan: usnure, as pt is ward of the state and trying to see if can place him on comfort measures , waiting to discuss further. Prognosis is poor Barrier: currently being treated for sepsis, A. fib, pneumonia , custody of state, waiting for decision by state to place pt on comfort measure, until then have to treat .      LOS: 3 days   Time spent: 45 minutes with more than 50%  COC    Nolberto Hanlon, MD [facility] Pager [phone]  If 7PM-7AM, please contact night-coverage [email or web address] Password [facility] 09-15-19, 12:44 PM Patient ID: Grayland Daisey, male   DOB: [birth year], 86 y.o.   MRN: [id number]

## 2019-09-21 NOTE — Progress Notes (Signed)
The nurse messaged Dr. Kurtis Bushman of pt's death.

## 2019-09-21 NOTE — Progress Notes (Signed)
The nurse messaged Dr. Kurtis Bushman of pt's BP of 98/41.  Waiting for response.

## 2019-09-21 NOTE — Progress Notes (Signed)
Ch met with Pt's family and friends during comfort care. Ch stayed with family for 40 minutes providing pastoral presence. Family has good support. Did not need prayer.

## 2019-09-21 NOTE — Progress Notes (Signed)
The nurse resumed care.  The patient is lying supine with nasal canula in mouth due to mouth breathing.  The patient is lying with eyes closed.  Difficult to arouse through speaking and touch.  Will continue to monitor.

## 2019-09-21 NOTE — Progress Notes (Signed)
Hospitalist updated on pt's condition at 0410, requesting an LR bolus after discussion sx of hypovolemia with charge RN and NP. New order received for 559ml bolus. Pt has been tachypneic throughout the night, RR 20-60 at times. Pt maintaining O2 sats of 90-100%. Pt resting comfortably remainder of the shift.

## 2019-09-21 NOTE — Plan of Care (Signed)
  Problem: Urinary Elimination: Goal: Signs and symptoms of infection will decrease Outcome: Progressing   

## 2019-09-21 NOTE — Progress Notes (Signed)
Progress Note  Patient Name: [name] Date of Encounter: 09-08-2019  Primary Cardiologist: Rockey Situ  Subjective   Restarted on amiodarone gtt secondary to recurrent Afib with RVR. Brief episode of hypotension overnight into the 08M systolic s/p LR. Somnolent.   Inpatient Medications    Scheduled Meds: . amiodarone  400 mg Oral BID  . atorvastatin  80 mg Oral q1800  . Chlorhexidine Gluconate Cloth  6 each Topical Daily  . clopidogrel  75 mg Oral Daily  . divalproex  125 mg Oral TID  . finasteride  5 mg Oral Daily  . heparin  5,000 Units Subcutaneous Q8H  . lidocaine  1 patch Transdermal Q24H  . PARoxetine  10 mg Oral Daily  . QUEtiapine  150 mg Oral QHS  . QUEtiapine  25 mg Oral BID  . senna  2 tablet Oral Daily  . tamsulosin  0.4 mg Oral Daily   Continuous Infusions: . sodium chloride 125 mL/hr at 08/27/19 2237  . amiodarone 60 mg/hr (08-Sep-2019 0345)  . piperacillin-tazobactam (ZOSYN)  IV 3.375 g (Sep 08, 2019 0056)   PRN Meds: acetaminophen **OR** acetaminophen, LORazepam, ondansetron **OR** ondansetron (ZOFRAN) IV   Vital Signs    Vitals:   Sep 08, 2019 0000 09/08/2019 0100 2019-09-08 0300 09-08-2019 0400  BP: (!) 95/58 (!) 100/49 (!) 95/46 (!) 97/44  Pulse: (!) 128 (!) 124 (!) 128 83  Resp: (!) 27 (!) 26 (!) 31 (!) 24  Temp:      TempSrc:      SpO2: 91% (!) 89% (!) 88% (!) 88%  Weight:      Height:        Intake/Output Summary (Last 24 hours) at September 08, 2019 0654 Last data filed at 09/08/19 0417 Gross per 24 hour  Intake 1787.64 ml  Output 900 ml  Net 887.64 ml   Filed Weights   08/31/2019 1056 08/28/19 0600  Weight: (S) 66.6 kg 61.5 kg    Telemetry    He was sinus rhythm with PACs upon being placed on telemetry with development of Afib with RVR around 5 AM on 3/7, lasting ~ 30 minutes with conversion to sinus rhythm around 5:37 AM on 3/7. He redeveloped Afib with RVR and remains in Afib with RVR vs MAT - Personally Reviewed  ECG    No new tracings  - Personally Reviewed  Physical Exam   GEN: Frail and ill appearing; No acute distress. Will not open eyes. Neck: No JVD. Cardiac: Tachycardic and irregular, no murmurs, rubs, or gallops.  Respiratory: Diminished breath sounds bilaterally.  GI: Soft, nontender, non-distended.   MS: No edema; No deformity. Neuro:  Somnolent.  Psych: Moans to sternal rub.  Labs    Chemistry Recent Labs  Lab 08/28/2019 1054 09/13/2019 1054 08/27/19 0448 08/28/19 0525 09/08/2019 0504  NA 142  --  147* 146*  --   K 3.4*  --  3.4* 3.3*  --   CL 109  --  118* 121*  --   CO2 20*  --  17* 19*  --   GLUCOSE 128*  --  118* 122*  --   BUN 85*  --  81* 80*  --   CREATININE 2.63*   < > 1.90* 1.82* 1.91*  CALCIUM 8.6*  --  8.1* 7.9*  --   PROT 6.8  --   --   --   --   ALBUMIN 2.3*  --   --   --   --   AST 36  --   --   --   --  ALT 20  --   --   --   --   ALKPHOS 78  --   --   --   --   BILITOT 0.9  --   --   --   --   GFRNONAA 21*   < > 31* 33* 31*  GFRAA 24*   < > 36* 38* 36*  ANIONGAP 13  --  12 6  --    < > = values in this interval not displayed.     Hematology Recent Labs  Lab 09/12/2019 1857 08/27/19 0448 08/28/19 0525  WBC 24.0* 22.7* 32.5*  RBC 3.55* 3.23* 3.08*  HGB 10.0* 8.9* 8.8*  HCT 30.2* 26.9* 26.5*  MCV 85.1 83.3 86.0  MCH 28.2 27.6 28.6  MCHC 33.1 33.1 33.2  RDW 18.6* 18.5* 18.1*  PLT 149* 165 149*    Cardiac EnzymesNo results for input(s): TROPONINI in the last 168 hours. No results for input(s): TROPIPOC in the last 168 hours.   BNPNo results for input(s): BNP, PROBNP in the last 168 hours.   DDimer No results for input(s): DDIMER in the last 168 hours.   Radiology    DG Chest 1 View  Result Date: 08/27/2019 IMPRESSION: 1. Airspace consolidation throughout the right lung concerning for pneumonia. 2. Left lung nodule, stable compared to the prior chest x-ray. 3. Aortic atherosclerosis. Electronically Signed   By: Vinnie Langton M.D.   On: 08/27/2019 06:04   DG  Chest Portable 1 View  Result Date: 08/23/2019 IMPRESSION: 1. New diffuse infiltrates throughout the right lung which could reflect infection or asymmetric edema in the acute setting. Persistent nodule in the left mid lung which was concerning for malignancy on prior PET-CT does not appear significantly changed. The right upper lobe nodule is now obscured by the new opacities. 2. Acute left lateral rib fracture. Electronically Signed   By: Audie Pinto M.D.   On: 09/17/2019 12:15    Cardiac Studies   2D echo 08/27/2019: 1. Left ventricular ejection fraction, by estimation, is 55 to 60%. Left  ventricular ejection fraction by PLAX is 56 %. The left ventricle has  normal function. The left ventricle has no regional wall motion  abnormalities. Left ventricular diastolic parameters were normal.  2. Right ventricular systolic function is normal.  3. The mitral valve was not assessed.   Patient Profile     86 y.o. male with history of CAD medically managed, CVA in 2013, carotid artery stenosis s/p prior stenting, HTN, HLD, and depression who we are seeing for Afib.   Assessment & Plan    1. New onset Afib with RVR: -Redeveloped Afib with RVR 3/9 -Continue IV amiodarone gtt for now -Discontinue oral amiodarone  -Relative hypotension precludes continuation of Toprol XL -Likely in the setting of acute PNA/sepsis and hypokalemia  -Management of PNA as below  -TSH elevated at 5.366, recommend adding free T4 -AST/ALT normal -Replete potassium to goal of 4.0 -CHADS2VASc 6 (HTN, age x 2, stroke x 2, vascular disease) -EP has recommended no long term, full dose OAC at this time given short-lived and isolated episode in the setting of infection and hypokalemia as well as with underlying anemia of uncertain etiology, encephalopathy  -Defer long term recommendations at this time pending [place] discussion  2. CAD: -Coronary artery calcium noted on prior imaging -HS-Tn 107 with a delta of  111 -Currently, no evidence of ACS -No plan for inpatient ischemic work up at this time with preserved LVSF this  admission -PTA Plavix in the setting of prior CVA  3. Possible aspiration PNA with sepsis: -Likely driving #1 -ABX per IM  4. ARF: -Admitted with a SCr 2.63 with a baseline around 1.2 -Likely ATN in the setting of dehydration and acute infection -Improving  5. Hypokalemia: -Replete to goal of 4.0  6. Anemia: -There is likely some component of dilution, though his HGB was 13.6 in 2019 and is currently 8.8 -Recommend further workup per IM  7. Prior CVA: -Remains on Plavix   8. Metabolic encephalopathy: -In the setting of the above -Overall, poor prognosis  -Recommend Palliative care consult for [place] discussion    For questions or updates, please contact [facility] Please consult [email or web address] for contact info under Cardiology/STEMI.    Signed, Christell Faith, PA-C [facility] Pager: [phone] 08/30/2019, 6:54 AM

## 2019-09-21 NOTE — Discharge Summary (Signed)
[name] IOE:[id number] DOB: 08/31/1929 DOA: 09/18/2019  PCP: Juluis Pitch, MD  Admit date: 08/21/2019 Discharge date: 09/06/2019  Admitted From: SNF Disposition: Died in hospital Brief/Interim Summary: [name] is an 86 y.o. male with medical history significant for Coronary artery disease, hypertension, depression and CVA who was brought into the emergency room by EMS for evaluation of mental status change Nursing home staff reported a pulse oximetry of 65% on room air at rest and patient does not normally wear oxygen.  His fingers were cold and they assumed that the pulse oximetry was not reading correctly. EMS placed on a nonrebreather mask and brought him to the emergency room.  Patient had mental status change while in the hospital.Lacticacid waselevated at 3.2 and WBC was 23K.chest x-ray showed infiltrates in the right lung.Patient received IV fluids per sepsis protocol and was started on IV antibiotic therapy. Patient was started on sepsis protocol for pneumonia.  He was found with acute kidney injury likely secondary to ATN from sepsis.  He was having atrial fibrillation with RVR.  Cardiology was consulted.  He was started on amiodarone drip.  Unfortunately he was not doing well and he was placed on comfort care with palliative care on board.  Decision was made by the state that patient can be placed on comfort care.  He passed away .  Principal Problem:   Sepsis ([place]) Active Problems:   Hypertension   Depression   AKI (acute kidney injury) ([place])   HCAP (healthcare-associated pneumonia)   Acute metabolic encephalopathy   Left rib fracture   Paroxysmal atrial fibrillation ([facility])   Comfort measures only status   DNR (do not resuscitate)   Palliative care by specialist       Allergies as of Sep 22, 2019      Reactions   Levofloxacin Other (See Comments)   Hyperactive delirium and hallucinations      Medication List    ASK your doctor  about these medications   acetaminophen 325 MG tablet Commonly known as: TYLENOL Take 650 mg by mouth every 4 (four) hours as needed for mild pain.   amLODipine 10 MG tablet Commonly known as: NORVASC Take 1 tablet (10 mg total) by mouth daily.   atorvastatin 80 MG tablet Commonly known as: LIPITOR Take 80 mg by mouth every evening. Ask about: Which instructions should I use?   clopidogrel 75 MG tablet Commonly known as: PLAVIX Take 1 tablet (75 mg total) by mouth daily.   divalproex 125 MG capsule Commonly known as: DEPAKOTE SPRINKLE Take 125 mg by mouth 3 (three) times daily.   finasteride 5 MG tablet Commonly known as: PROSCAR Take 1 tablet (5 mg total) by mouth daily.   lisinopril 40 MG tablet Commonly known as: ZESTRIL Take 40 mg by mouth daily. Ask about: Which instructions should I use?   LORazepam 0.5 MG tablet Commonly known as: ATIVAN Take 0.5 mg by mouth 2 (two) times daily as needed for anxiety (and agitation).   metoprolol succinate 50 MG 24 hr tablet Commonly known as: TOPROL-XL Take 50 mg by mouth daily. Take with or immediately following a meal. Ask about: Which instructions should I use?   multivitamins ther. w/minerals Tabs tablet Take 1 tablet by mouth daily.   PARoxetine 10 MG tablet Commonly known as: PAXIL Take 10 mg by mouth daily.   polyethylene glycol 17 g packet Commonly known as: MIRALAX / GLYCOLAX Take 17 g by mouth daily as needed for moderate constipation.   QUEtiapine  100 MG tablet Commonly known as: SEROQUEL Take 150 mg by mouth at bedtime.   QUEtiapine 25 MG tablet Commonly known as: SEROQUEL Take 25 mg by mouth 2 (two) times daily.   senna 8.6 MG Tabs tablet Commonly known as: SENOKOT Take 2 tablets by mouth daily.   tamsulosin 0.4 MG Caps capsule Commonly known as: FLOMAX Take 0.4 mg by mouth daily.   traZODone 50 MG tablet Commonly known as: DESYREL Take 100 mg by mouth at bedtime.       Allergies  Allergen  Reactions  . Levofloxacin Other (See Comments)    Hyperactive delirium and hallucinations   Procedures/Studies: DG Chest 1 View  Result Date: 08/27/2019 CLINICAL DATA:  86 year old male with history of shortness of breath. EXAM: CHEST  1 VIEW COMPARISON:  Chest x-ray 09/01/2019. FINDINGS: Again noted is severe multifocal ill-defined airspace consolidation and interstitial prominence throughout the right lung. Ill-defined nodularity again noted in the left mid lung. Interstitial prominence noted at the left lung base. No pleural effusions. No evidence of pulmonary edema. Heart size is normal. The patient is rotated to the right on today's exam, resulting in distortion of the mediastinal contours and reduced diagnostic sensitivity and specificity for mediastinal pathology. Atherosclerosis in the thoracic aorta. IMPRESSION: 1. Airspace consolidation throughout the right lung concerning for pneumonia. 2. Left lung nodule, stable compared to the prior chest x-ray. 3. Aortic atherosclerosis. Electronically Signed   By: Vinnie Langton M.D.   On: 08/27/2019 06:04   DG Chest Portable 1 View  Result Date: 09/19/2019 CLINICAL DATA:  Altered behavior. Low O2 sats. EXAM: PORTABLE CHEST 1 VIEW COMPARISON:  Chest radiograph 02/03/2019 FINDINGS: Stable cardiomediastinal contours. There are new diffuse infiltrates throughout the right lung which could reflect infection or asymmetric edema. Persistent nodule in the left mid lung better evaluated on prior chest CT. The previously seen nodule in the right upper lobe is now obscured by the new opacities. No pneumothorax or significant pleural effusion. There is an acute left lateral rib fracture. IMPRESSION: 1. New diffuse infiltrates throughout the right lung which could reflect infection or asymmetric edema in the acute setting. Persistent nodule in the left mid lung which was concerning for malignancy on prior PET-CT does not appear significantly changed. The right upper  lobe nodule is now obscured by the new opacities. 2. Acute left lateral rib fracture. Electronically Signed   By: Audie Pinto M.D.   On: 09/14/2019 12:15   ECHOCARDIOGRAM LIMITED  Result Date: 08/28/2019    ECHOCARDIOGRAM LIMITED REPORT   Patient Name:   [name] [place] Date of Exam: 08/27/2019 Medical Rec #:  [id number]                 Height:       72.0 in Accession #:    [id number]                Weight:       146.8 lb Date of Birth:  Feb 06, 1930                 BSA:          1.868 m Patient Age:    76 years                  BP:           104/51 mmHg Patient Gender: M  HR:           93 bpm. Exam Location:  [facility] Procedure: Limited Echo Indications:     Atrial Fibrillation 427.31/ I48.91  History:         Patient has no prior history of Echocardiogram examinations.  Sonographer:     Arville Go RDCS Referring Phys:  Benedict Diagnosing Phys: Bartholome Bill MD  Sonographer Comments: Technically difficult study due to poor echo windows. IMPRESSIONS  1. Left ventricular ejection fraction, by estimation, is 55 to 60%. Left ventricular ejection fraction by PLAX is 56 %. The left ventricle has normal function. The left ventricle has no regional wall motion abnormalities. Left ventricular diastolic parameters were normal.  2. Right ventricular systolic function is normal.  3. The mitral valve was not assessed. FINDINGS  Left Ventricle: Left ventricular ejection fraction, by estimation, is 55 to 60%. Left ventricular ejection fraction by PLAX is 56 % The left ventricle has normal function. The left ventricle has no regional wall motion abnormalities. The left ventricular internal cavity size was normal in size. There is no left ventricular hypertrophy. Right Ventricle: Right ventricular systolic function is normal. Left Atrium: Left atrial size was normal in size. Mitral Valve: The mitral valve was not assessed.  LEFT VENTRICLE PLAX 2D LV EF:         Left  ventricular ejection fraction by PLAX is 56 % LVIDd:         4.07 cm LVIDs:         2.89 cm LV PW:         1.31 cm LV IVS:        1.36 cm LVOT diam:     1.90 cm LVOT Area:     2.84 cm  LEFT ATRIUM         Index LA diam:    3.30 cm 1.77 cm/m   AORTA Ao Root diam: 3.60 cm  SHUNTS Systemic Diam: 1.90 cm Bartholome Bill MD Electronically signed by Bartholome Bill MD Signature Date/Time: 08/28/2019/7:17:15 AM    Final         Discharge Exam: Vitals:   2019/09/17 1600 Sep 17, 2019 1630  BP:    Pulse: 69 86  Resp: (!) 27 (!) 31  Temp:    SpO2: (!) 84% (!) 84%   Vitals:   09/17/2019 1500 09/17/19 1600 September 17, 2019 1630 09-17-2019 1808  BP: (!) 109/45     Pulse: 76 69 86   Resp: (!) 23 (!) 27 (!) 31   Temp:      TempSrc:      SpO2: 91% (!) 84% (!) 84%   Weight:    66.6 kg  Height:    6' (1.829 m)      The results of significant diagnostics from this hospitalization (including imaging, microbiology, ancillary and laboratory) are listed below for reference.     Microbiology: No results found for this or any previous visit (from the past 240 hour(s)).   Labs: BNP (last 3 results) No results for input(s): BNP in the last 8760 hours. Basic Metabolic Panel: No results for input(s): NA, K, CL, CO2, GLUCOSE, BUN, CREATININE, CALCIUM, MG, PHOS in the last 168 hours. Liver Function Tests: No results for input(s): AST, ALT, ALKPHOS, BILITOT, PROT, ALBUMIN in the last 168 hours. No results for input(s): LIPASE, AMYLASE in the last 168 hours. No results for input(s): AMMONIA in the last 168 hours. CBC: No results for input(s): WBC, NEUTROABS, HGB, HCT, MCV, PLT in the  last 168 hours. Cardiac Enzymes: No results for input(s): CKTOTAL, CKMB, CKMBINDEX, TROPONINI in the last 168 hours. BNP: Invalid input(s): POCBNP CBG: No results for input(s): GLUCAP in the last 168 hours. D-Dimer No results for input(s): DDIMER in the last 72 hours. Hgb A1c No results for input(s): HGBA1C in the last 72 hours. Lipid  Profile No results for input(s): CHOL, HDL, LDLCALC, TRIG, CHOLHDL, LDLDIRECT in the last 72 hours. Thyroid function studies No results for input(s): TSH, T4TOTAL, T3FREE, THYROIDAB in the last 72 hours.  Invalid input(s): FREET3 Anemia work up No results for input(s): VITAMINB12, FOLATE, FERRITIN, TIBC, IRON, RETICCTPCT in the last 72 hours. Urinalysis    Component Value Date/Time   COLORURINE AMBER (A) 09/19/2019 1318   APPEARANCEUR HAZY (A) 09/14/2019 1318   APPEARANCEUR Hazy 11/22/2013 1338   LABSPEC 1.016 09/07/2019 1318   LABSPEC 1.018 11/22/2013 1338   PHURINE 5.0 08/28/2019 1318   GLUCOSEU NEGATIVE 09/08/2019 1318   GLUCOSEU Negative 11/22/2013 1338   HGBUR SMALL (A) 09/20/2019 1318   BILIRUBINUR NEGATIVE 08/24/2019 1318   BILIRUBINUR negative 10/19/2017 1619   BILIRUBINUR Negative 11/22/2013 1338   KETONESUR NEGATIVE 08/22/2019 1318   PROTEINUR NEGATIVE 09/17/2019 1318   UROBILINOGEN 4.0 (A) 10/19/2017 1619   NITRITE NEGATIVE 09/03/2019 1318   LEUKOCYTESUR NEGATIVE 09/17/2019 1318   LEUKOCYTESUR Trace 11/22/2013 1338   Sepsis Labs Invalid input(s): PROCALCITONIN,  WBC,  LACTICIDVEN Microbiology No results found for this or any previous visit (from the past 240 hour(s)).   Time coordinating discharge: Over 30 minutes  SIGNED:   Nolberto Hanlon, MD  [facility] 09/06/2019, 5:35 PM Pager   If 7PM-7AM, please contact night-coverage [email or web address] Password [email or web address]

## 2019-09-21 DEATH — deceased

## 2020-07-18 IMAGING — CT CT HEAD WITHOUT CONTRAST
3 series · 15 of 47 positions shown, 18 images · non-contrast
Comparison: Prior CT from 11/22/2013.

CLINICAL DATA: Initial evaluation for acute head trauma.
Unwitnessed fall.

EXAM:
CT HEAD WITHOUT CONTRAST
TECHNIQUE: Contiguous axial images were obtained from the base of the skull
through the vertex without intravenous contrast.

[Series 3: ax head wo · axial · 0.36mm/px · z∈[-174,-36]mm · 9 of 34 slices shown, 12 images]
[im 3/34  brain]
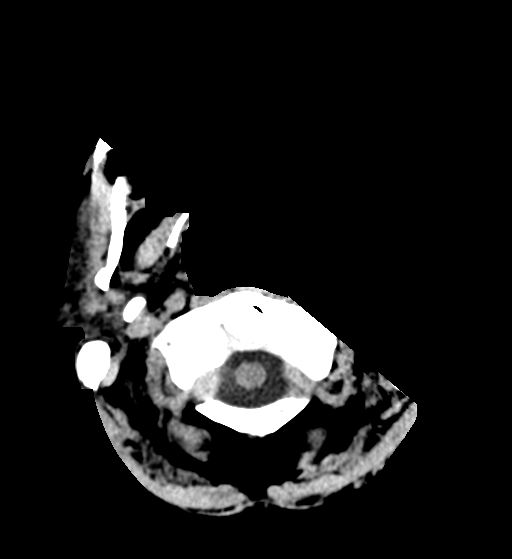
[im 3/34  bone]
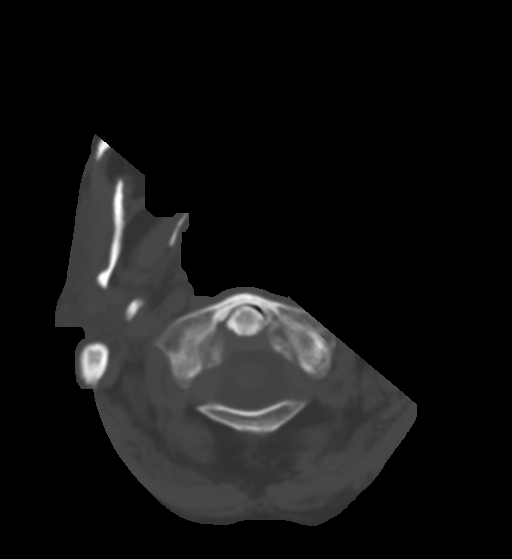
[im 6/34  brain]
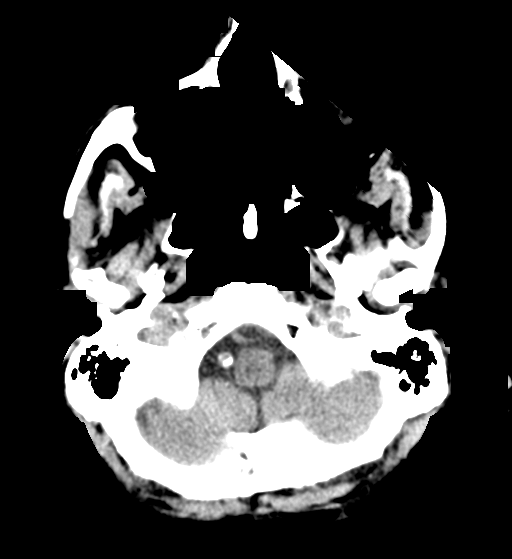
[im 10/34  brain]
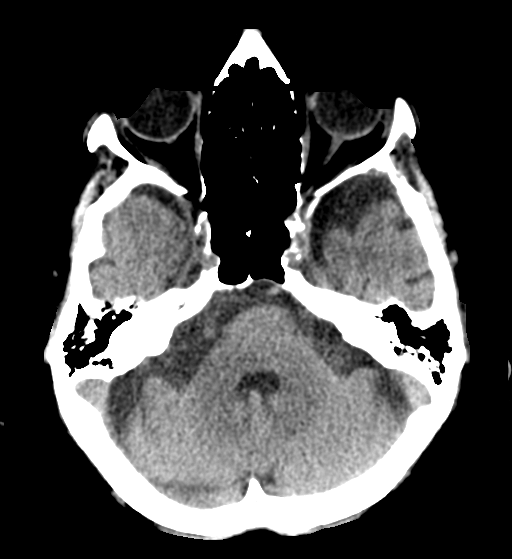
[im 13/34  brain]
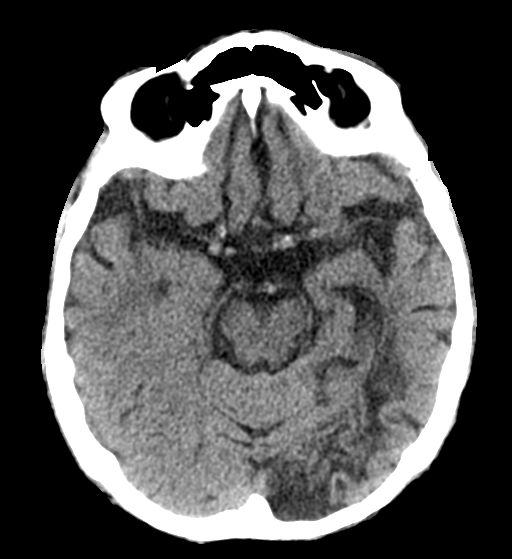
[im 18/34  brain]
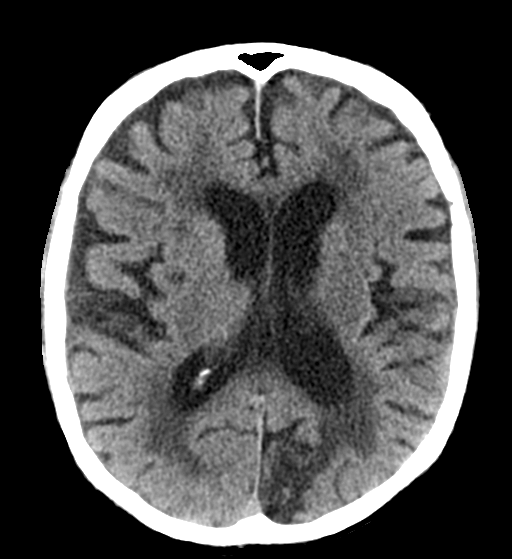
[im 18/34  bone]
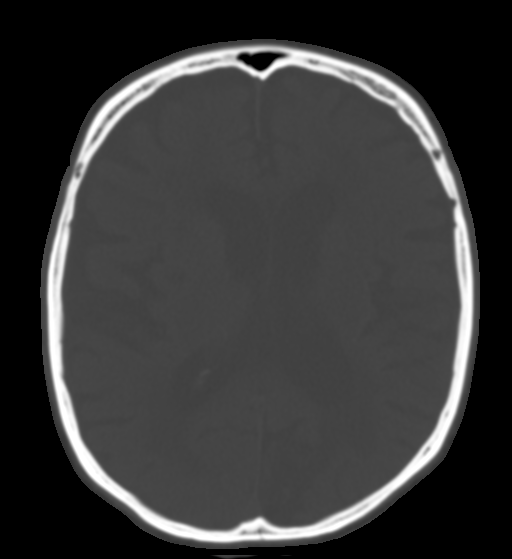
[im 21/34  brain]
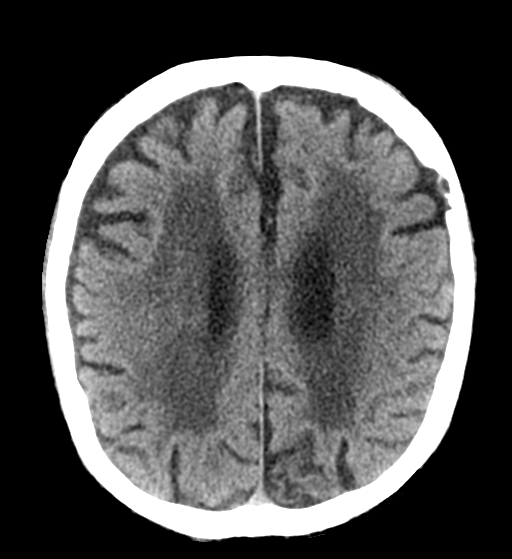
[im 24/34  brain]
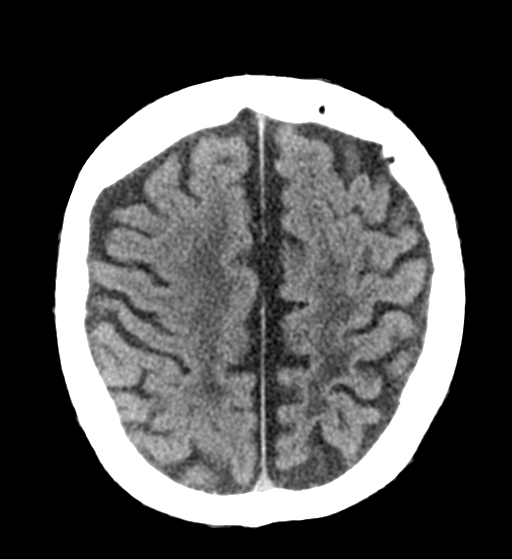
[im 28/34  brain]
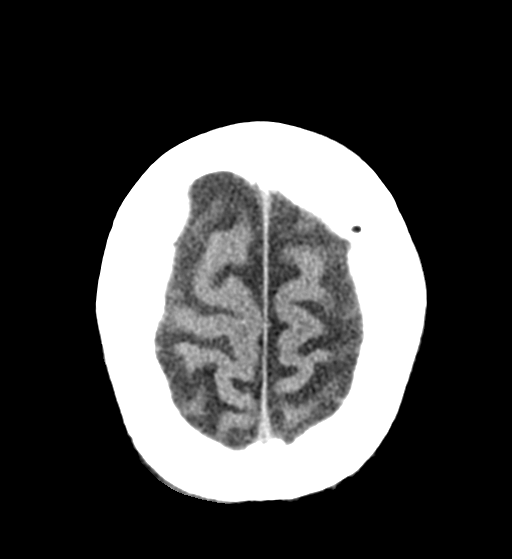
[im 31/34  brain]
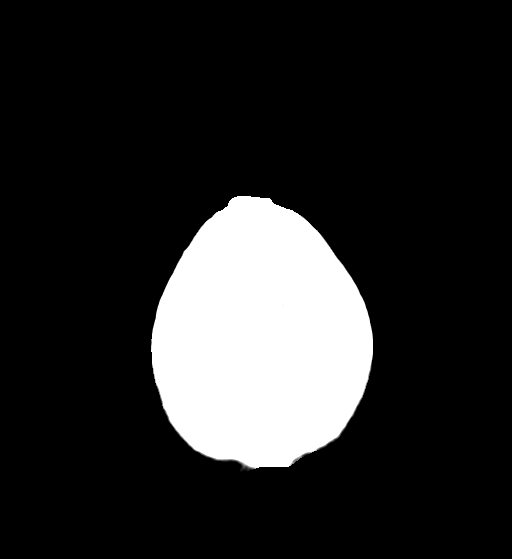
[im 31/34  bone]
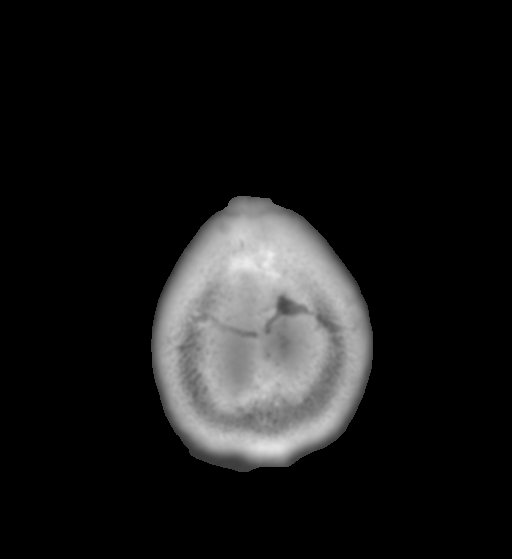

[Series 4: coronal soft tissue · coronal · 0.33mm/px · 3 of 65 slices shown]
[im 22/65  brain]
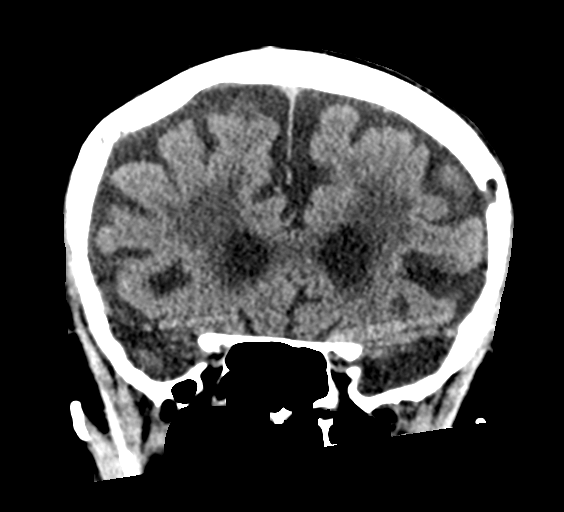
[im 29/65  brain]
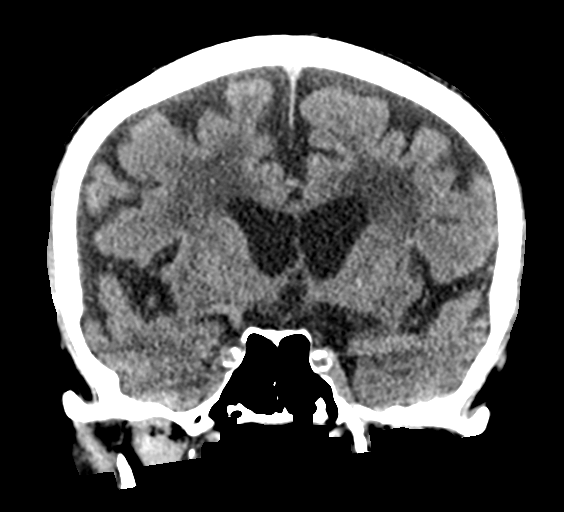
[im 36/65  brain]
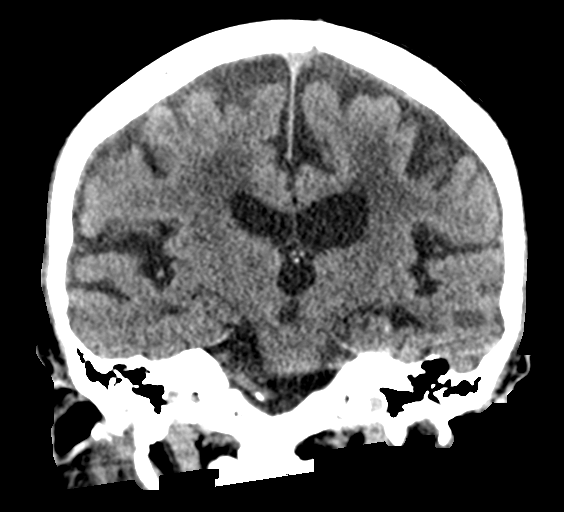

[Series 5: sagittal soft tissue · sagittal · 0.33mm/px · 3 of 59 slices shown]
[im 23/59  brain]
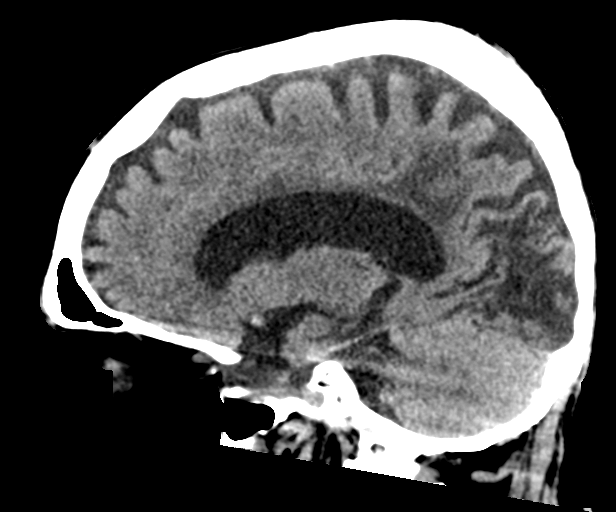
[im 30/59  brain]
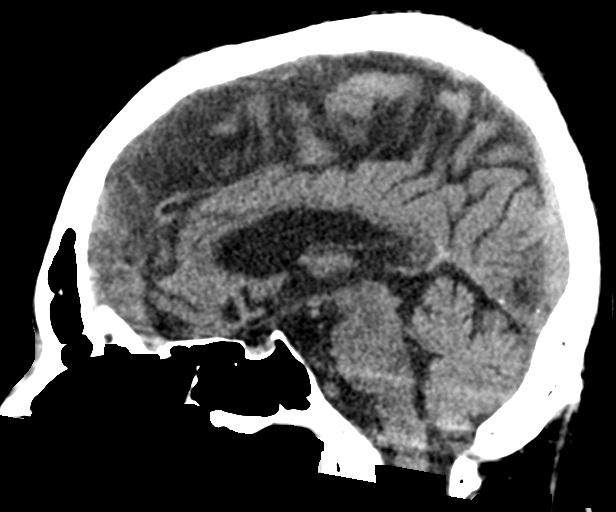
[im 37/59  brain]
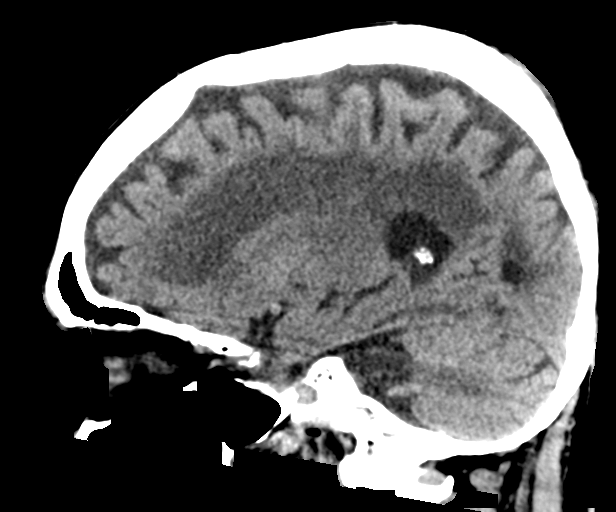

[15 of 47 positions shown; findings below may reference images not displayed]

FINDINGS: Brain: Moderately advanced age-related cerebral atrophy with chronic
small vessel ischemic disease. Chronic left PCA territory infarct.
No acute intracranial hemorrhage. No acute large vessel territory
infarct. No mass lesion, midline shift or mass effect. No
hydrocephalus. No extra-axial fluid collection.

Vascular: No hyperdense vessel. Calcified atherosclerosis at the
skull base.

Skull: Small l right parietal scalp contusion/laceration. Calvarium
intact.

Sinuses/Orbits: Globes and orbital soft tissues demonstrate no acute
finding. Paranasal sinuses mastoid air cells are clear.

Other: None.
IMPRESSION: 1. No acute intracranial abnormality.
2. Small right parietal scalp contusion/laceration. No calvarial
fracture.
3. Moderately advanced age-related cerebral atrophy with chronic
small vessel ischemic disease, with superimposed chronic left PCA
territory infarct.
